# Patient Record
Sex: Female | Born: 1975 | Race: White | Hispanic: No | Smoking: Former smoker
Health system: Southern US, Community
[De-identification: ages and names within clinical notes are randomized; demographics above are authoritative.]

## PROBLEM LIST (undated history)

## (undated) DIAGNOSIS — F419 Anxiety disorder, unspecified: Secondary | ICD-10-CM

## (undated) DIAGNOSIS — D693 Immune thrombocytopenic purpura: Secondary | ICD-10-CM

## (undated) DIAGNOSIS — G40909 Epilepsy, unspecified, not intractable, without status epilepticus: Secondary | ICD-10-CM

## (undated) DIAGNOSIS — E669 Obesity, unspecified: Secondary | ICD-10-CM

## (undated) DIAGNOSIS — D649 Anemia, unspecified: Secondary | ICD-10-CM

## (undated) HISTORY — PX: CHOLECYSTECTOMY: SHX55

## (undated) HISTORY — PX: KNEE ARTHROSCOPY: SUR90

## (undated) HISTORY — PX: TONSILLECTOMY: SUR1361

## (undated) HISTORY — PX: ANKLE ARTHROSCOPY: SUR85

## (undated) HISTORY — PX: OTHER SURGICAL HISTORY: SHX169

---

## 2003-01-25 ENCOUNTER — Ambulatory Visit (HOSPITAL_BASED_OUTPATIENT_CLINIC_OR_DEPARTMENT_OTHER): Admission: RE | Admit: 2003-01-25 | Discharge: 2003-01-25 | Payer: Self-pay | Admitting: Orthopedic Surgery

## 2003-04-12 ENCOUNTER — Ambulatory Visit (HOSPITAL_BASED_OUTPATIENT_CLINIC_OR_DEPARTMENT_OTHER): Admission: RE | Admit: 2003-04-12 | Discharge: 2003-04-12 | Payer: Self-pay | Admitting: Orthopedic Surgery

## 2003-08-13 ENCOUNTER — Inpatient Hospital Stay (HOSPITAL_COMMUNITY): Admission: EM | Admit: 2003-08-13 | Discharge: 2003-08-14 | Payer: Self-pay | Admitting: *Deleted

## 2003-08-28 ENCOUNTER — Inpatient Hospital Stay (HOSPITAL_COMMUNITY): Admission: RE | Admit: 2003-08-28 | Discharge: 2003-08-30 | Payer: Self-pay | Admitting: Orthopedic Surgery

## 2004-06-22 HISTORY — PX: SPLENECTOMY, TOTAL: SHX788

## 2004-07-16 ENCOUNTER — Ambulatory Visit: Payer: Self-pay | Admitting: Internal Medicine

## 2004-09-26 ENCOUNTER — Ambulatory Visit: Payer: Self-pay | Admitting: Obstetrics and Gynecology

## 2005-09-15 ENCOUNTER — Emergency Department: Payer: Self-pay | Admitting: Emergency Medicine

## 2005-09-15 ENCOUNTER — Other Ambulatory Visit: Payer: Self-pay

## 2006-10-01 ENCOUNTER — Emergency Department: Payer: Self-pay | Admitting: Emergency Medicine

## 2007-06-23 HISTORY — PX: TUBAL LIGATION: SHX77

## 2007-07-19 ENCOUNTER — Ambulatory Visit: Payer: Self-pay | Admitting: Internal Medicine

## 2007-08-28 ENCOUNTER — Ambulatory Visit: Payer: Self-pay | Admitting: Family Medicine

## 2007-12-28 ENCOUNTER — Ambulatory Visit: Payer: Self-pay | Admitting: Gynecology

## 2007-12-28 ENCOUNTER — Encounter (INDEPENDENT_AMBULATORY_CARE_PROVIDER_SITE_OTHER): Payer: Self-pay | Admitting: Gynecology

## 2008-02-21 ENCOUNTER — Ambulatory Visit: Payer: Self-pay | Admitting: Gynecology

## 2008-02-21 ENCOUNTER — Ambulatory Visit (HOSPITAL_COMMUNITY): Admission: RE | Admit: 2008-02-21 | Discharge: 2008-02-21 | Payer: Self-pay | Admitting: Gynecology

## 2008-11-07 ENCOUNTER — Emergency Department (HOSPITAL_COMMUNITY): Admission: EM | Admit: 2008-11-07 | Discharge: 2008-11-07 | Payer: Self-pay | Admitting: Emergency Medicine

## 2009-05-08 ENCOUNTER — Emergency Department (HOSPITAL_COMMUNITY): Admission: EM | Admit: 2009-05-08 | Discharge: 2009-05-08 | Payer: Self-pay | Admitting: Emergency Medicine

## 2010-03-13 ENCOUNTER — Emergency Department (HOSPITAL_COMMUNITY)
Admission: EM | Admit: 2010-03-13 | Discharge: 2010-03-13 | Payer: Self-pay | Source: Home / Self Care | Admitting: Emergency Medicine

## 2010-05-06 ENCOUNTER — Emergency Department (HOSPITAL_BASED_OUTPATIENT_CLINIC_OR_DEPARTMENT_OTHER): Admission: EM | Admit: 2010-05-06 | Discharge: 2010-05-06 | Payer: Self-pay | Admitting: Emergency Medicine

## 2010-09-24 LAB — COMPREHENSIVE METABOLIC PANEL
ALT: 19 U/L (ref 0–35)
AST: 24 U/L (ref 0–37)
Albumin: 3.7 g/dL (ref 3.5–5.2)
Alkaline Phosphatase: 90 U/L (ref 39–117)
BUN: 9 mg/dL (ref 6–23)
CO2: 23 mEq/L (ref 19–32)
Calcium: 8.3 mg/dL — ABNORMAL LOW (ref 8.4–10.5)
Chloride: 112 mEq/L (ref 96–112)
Creatinine, Ser: 0.45 mg/dL (ref 0.4–1.2)
GFR calc Af Amer: >60 mL/min (ref >60)
GFR calc non Af Amer: >60 mL/min (ref >60)
Glucose, Bld: 99 mg/dL (ref 70–99)
Potassium: 3.5 mEq/L (ref 3.5–5.1)
Sodium: 140 mEq/L (ref 135–145)
Total Bilirubin: 0.2 mg/dL — ABNORMAL LOW (ref 0.3–1.2)
Total Protein: 6.4 g/dL (ref 6.0–8.3)

## 2010-09-24 LAB — CBC
HCT: 36.5 % (ref 36.0–46.0)
Hemoglobin: 11.7 g/dL — ABNORMAL LOW (ref 12.0–15.0)
MCHC: 31.9 g/dL (ref 30.0–36.0)
MCV: 84.7 fL (ref 78.0–100.0)
Platelets: 215 10*3/uL (ref 150–400)
RBC: 4.31 MIL/uL (ref 3.87–5.11)
RDW: 16.3 % — ABNORMAL HIGH (ref 11.5–15.5)
WBC: 11 10*3/uL — ABNORMAL HIGH (ref 4.0–10.5)
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
## 2010-11-04 NOTE — Op Note (Signed)
NAMEGENEVE, KIMPEL NO.:  [id number]   MEDICAL RECORD NO.:  [id number]          PATIENT TYPE:  AMB   LOCATION:  [facility]                           FACILITY:  [facility]   PHYSICIAN:  Ginger Carne, MD  DATE OF BIRTH:  Apr 07, 1976   DATE OF PROCEDURE:  02/21/2008  DATE OF DISCHARGE:                               OPERATIVE REPORT   PREOPERATIVE DIAGNOSIS:  Sterilization.   POSTOPERATIVE DIAGNOSIS:  Sterilization.   PROCEDURE:  Bilateral laparoscopic tubal cauterization.   SURGEON:  Ginger Carne, MD   ASSISTANT:  None.   COMPLICATIONS:  None immediate.   ESTIMATED BLOOD LOSS:  Minimal.   SPECIMEN:  None.   OPERATIVE FINDINGS:  External genitalia, vulva and vagina were normal.  Cervix, smooth without erosions or lesions.  The uterus was normal in  size.  Both tubes and ovaries appeared normal.  Both tubes were  identified, separated apart from their respective round ligaments.  No  intrapelvic pathology noted.   OPERATIVE PROCEDURE:  The patient was prepped and draped in usual  fashion and placed in the lithotomy position.  Betadine solution was  used for antiseptic, and the patient was catheterized prior to the  procedure.  After adequate general anesthesia, a tenaculum was placed on  the anterior lip of the cervix and a Hickman tenaculum was placed in the  endocervical canal.  Afterwards, a vertical infraumbilical incision was  made, and a Veress needle placed in the abdomen.  Opening and closing  pressures were 10-15 mmHg.  Needle released, trocar placed in same  incision.  Laparoscope placed in trocar sleeve.  A 5-mm port was made in  the left lower quadrant under direct visualization.  Both tubes were  grasped between the isthmus and ampullary junction, 2-3 cm of tube on  either side with bipolar cauterized and cut in the center of  cauterization.  No active bleeding noted.  Gas released, trocars  removed.  Closure with 10-mm fascia site with 0 Vicryl  suture and 4-0  Vicryl for subcuticular closure.  Instruments and sponge count were  correct.  The patient tolerated the procedure well and returned to the  post anesthesia recovery room in excellent condition.      Ginger Carne, MD  Electronically Signed    SHB/MEDQ  D:  02/21/2008  T:  02/22/2008  Job:  [id number]
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
## 2010-11-07 NOTE — Op Note (Signed)
[name], [name]                          ACCOUNT NO.:  [id number]   MEDICAL RECORD NO.:  [id number]                   PATIENT TYPE:  AMB   LOCATION:  [facility]                                  FACILITY:  [facility]   PHYSICIAN:  Nadara Mustard, M.D.                DATE OF BIRTH:  [birth year]   DATE OF PROCEDURE:  01/25/2003  DATE OF DISCHARGE:                                 OPERATIVE REPORT   PREOPERATIVE DIAGNOSIS:  Osteoarthritis, right ankle.   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   PROCEDURE:  1. Partial synovectomy, right ankle.  2. Removal of multiple loose bodies.  3. Abrasion chondroplasty.   ESTIMATED BLOOD LOSS:  Minimal.   ANTIBIOTICS:  None.   DRAINS:  None.   COMPLICATIONS:  None.   TOURNIQUET TIME:  None.   DISPOSITION:  To the PACU in stable condition.   INDICATIONS FOR PROCEDURE:  The patient is a 36 year old woman who is status  post fracture and internal fixation of  her right ankle. The patient has  undergone multiple surgical procedures and presents at this time with  decreased range of motion and persistent pain. The patient has failed  conservative care. She has had temporary relief with steroid injections and  presents at this time for arthroscopic intervention.   The risks and benefits were  discussed including infection, neurovascular  injury, persistent pain and  need for additional surgery. The patient states  she understands and wishes to proceed at this time.   DESCRIPTION OF PROCEDURE:  The patient was brought to operating room #5 and  underwent a general anesthetic. After adequate levels of anesthesia were  obtained the patient's right lower extremity was placed in the ankle  distractor and the right lower extremity was prepped using Duraprep and  draped into a sterile field.   An 18 guage spinal was used to insert through the anteromedial portal. The  joint was infused with  approximately 8 mL of normal saline. The joint space  was extremely tight. There was good backflow from the 18 guage needle.   The skin was then incised and blunt dissection was carried down to the  capsule. The scope was then inserted through the inferomedial portal.  Visualization showed multiple  loose bodies with grade 4 chondromalacia of  the ankle joint with large osteophytic spurs. An 18 guage spinal was used to  localize the anterolateral portal. Care was taken with the lights off to  ensure that there was no nerve involvement with the portal selection.   After the skin was incised blunt dissection was carried down to the fascia  and a blunt trocar was used to make the anterolateral portal. The shaver was  inserted and multiple loose bodies were  removed as well as abrasion  chondroplasty over the anterolateral aspect of the talar dome. There was a  significant  amount of synovitis in both the gutters and anteriorly. This was  debrided and a large osteophytic bone spur was debrided off the anterior  aspect.   After partial synovectomy, removal of loose bodies and abrasion  chondroplasty, a ring curet was used to smooth the surface of  the abrasion  chondroplasty. After release of pressure from the scope there was good  bleeding from the area of the abrasion chondroplasty. The scope pressure was  then reinflated and the vapor Mitek was used for hemostasis. This was not  used on the cartilage or the bone. Visualization showed improvement in the  joint  surface with the large anterolateral osteochondral defect.   The instruments were removed. The portals were closed using 3-0 nylon. The  wound was then injected with a total of 10 mL of 0.5% Marcaine plain with 4  mg of morphine. The wounds after closure were covered with Adaptic  orthopedic sponges, ABD dressing, Webril and a Coban dressing.   The patient was extubated. She was taken to the PACU in stable condition.  She was  planned for discharge to home. Weight bearing as tolerated. Plan to  follow up in the office in 2 weeks.                                               Nadara Mustard, M.D.    MVD/MEDQ  D:  01/25/2003  T:  01/26/2003  Job:  [id number]
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
## 2010-12-09 ENCOUNTER — Ambulatory Visit: Payer: Self-pay | Admitting: Internal Medicine

## 2012-12-25 ENCOUNTER — Emergency Department: Payer: Self-pay | Admitting: Emergency Medicine

## 2013-08-04 DIAGNOSIS — I10 Essential (primary) hypertension: Secondary | ICD-10-CM | POA: Insufficient documentation

## 2014-06-22 HISTORY — PX: GASTRIC BYPASS: SHX52

## 2014-08-12 DIAGNOSIS — G40909 Epilepsy, unspecified, not intractable, without status epilepticus: Secondary | ICD-10-CM | POA: Insufficient documentation

## 2014-08-12 DIAGNOSIS — D693 Immune thrombocytopenic purpura: Secondary | ICD-10-CM | POA: Insufficient documentation

## 2014-08-13 DIAGNOSIS — F1021 Alcohol dependence, in remission: Secondary | ICD-10-CM | POA: Insufficient documentation

## 2014-08-13 DIAGNOSIS — F4310 Post-traumatic stress disorder, unspecified: Secondary | ICD-10-CM | POA: Insufficient documentation

## 2014-08-23 DIAGNOSIS — F1111 Opioid abuse, in remission: Secondary | ICD-10-CM | POA: Insufficient documentation

## 2015-01-27 ENCOUNTER — Ambulatory Visit
Admission: EM | Admit: 2015-01-27 | Discharge: 2015-01-27 | Disposition: A | Discharge status: Home / Self Care | Payer: Medicaid Other | Attending: Internal Medicine | Admitting: Internal Medicine

## 2015-01-27 ENCOUNTER — Encounter: Payer: Self-pay | Admitting: Emergency Medicine

## 2015-01-27 DIAGNOSIS — M545 Low back pain: Secondary | ICD-10-CM

## 2015-01-27 DIAGNOSIS — M7552 Bursitis of left shoulder: Secondary | ICD-10-CM

## 2015-01-27 HISTORY — DX: Anemia, unspecified: D64.9

## 2015-01-27 HISTORY — DX: Immune thrombocytopenic purpura: D69.3

## 2015-01-27 HISTORY — DX: Anxiety disorder, unspecified: F41.9

## 2015-01-27 HISTORY — DX: Epilepsy, unspecified, not intractable, without status epilepticus: G40.909

## 2015-01-27 MED ORDER — HYDROCODONE-ACETAMINOPHEN 5-325 MG PO TABS: 1.0000 | ORAL_TABLET | Freq: Four times a day (QID) | ORAL | Status: DC | PRN

## 2015-01-27 NOTE — Discharge Instructions (Signed)
Prescription for pain medicine vicodin, to use sparingly. Continue using ice.   Prescription for physical therapy given. Followup up orthopedics if not improving in several days.  Impingement Syndrome, Rotator Cuff, Bursitis with Rehab Impingement syndrome is a condition that involves inflammation of the tendons of the rotator cuff and the subacromial bursa, that causes pain in the shoulder. The rotator cuff consists of four tendons and muscles that control much of the shoulder and upper arm function. The subacromial bursa is a fluid filled sac that helps reduce friction between the rotator cuff and one of the bones of the shoulder (acromion). Impingement syndrome is usually an overuse injury that causes swelling of the bursa (bursitis), swelling of the tendon (tendonitis), and/or a tear of the tendon (strain). Strains are classified into three categories. Grade 1 strains cause pain, but the tendon is not lengthened. Grade 2 strains include a lengthened ligament, due to the ligament being stretched or partially ruptured. With grade 2 strains there is still function, although the function may be decreased. Grade 3 strains include a complete tear of the tendon or muscle, and function is usually impaired. SYMPTOMS   Pain around the shoulder, often at the outer portion of the upper arm.  Pain that gets worse with shoulder function, especially when reaching overhead or lifting.  Sometimes, aching when not using the arm.  Pain that wakes you up at night.  Sometimes, tenderness, swelling, warmth, or redness over the affected area.  Loss of strength.  Limited motion of the shoulder, especially reaching behind the back (to the back pocket or to unhook bra) or across your body.  Crackling sound (crepitation) when moving the arm.  Biceps tendon pain and inflammation (in the front of the shoulder). Worse when bending the elbow or lifting. CAUSES  Impingement syndrome is often an overuse injury, in  which chronic (repetitive) motions cause the tendons or bursa to become inflamed. A strain occurs when a force is paced on the tendon or muscle that is greater than it can withstand. Common mechanisms of injury include: Stress from sudden increase in duration, frequency, or intensity of training.  Direct hit (trauma) to the shoulder.  Aging, erosion of the tendon with normal use.  Bony bump on shoulder (acromial spur). RISK INCREASES WITH:  Contact sports (football, wrestling, boxing).  Throwing sports (baseball, tennis, volleyball).  Weightlifting and bodybuilding.  Heavy labor.  Previous injury to the rotator cuff, including impingement.  Poor shoulder strength and flexibility.  Failure to warm up properly before activity.  Inadequate protective equipment.  Old age.  Bony bump on shoulder (acromial spur). PREVENTION   Warm up and stretch properly before activity.  Allow for adequate recovery between workouts.  Maintain physical fitness:  Strength, flexibility, and endurance.  Cardiovascular fitness.  Learn and use proper exercise technique. PROGNOSIS  If treated properly, impingement syndrome usually goes away within 6 weeks. Sometimes surgery is required.  RELATED COMPLICATIONS   Longer healing time if not properly treated, or if not given enough time to heal.  Recurring symptoms, that result in a chronic condition.  Shoulder stiffness, frozen shoulder, or loss of motion.  Rotator cuff tendon tear.  Recurring symptoms, especially if activity is resumed too soon, with overuse, with a direct blow, or when using poor technique. TREATMENT  Treatment first involves the use of ice and medicine, to reduce pain and inflammation. The use of strengthening and stretching exercises may help reduce pain with activity. These exercises may be performed at home or  with a therapist. If non-surgical treatment is unsuccessful after more than 6 months, surgery may be advised.  After surgery and rehabilitation, activity is usually possible in 3 months.  MEDICATION  If pain medicine is needed, nonsteroidal anti-inflammatory medicines (aspirin and ibuprofen), or other minor pain relievers (acetaminophen), are often advised.  Do not take pain medicine for 7 days before surgery.  Prescription pain relievers may be given, if your caregiver thinks they are needed. Use only as directed and only as much as you need.  Corticosteroid injections may be given by your caregiver. These injections should be reserved for the most serious cases, because they may only be given a certain number of times. HEAT AND COLD  Cold treatment (icing) should be applied for 10 to 15 minutes every 2 to 3 hours for inflammation and pain, and immediately after activity that aggravates your symptoms. Use ice packs or an ice massage.  Heat treatment may be used before performing stretching and strengthening activities prescribed by your caregiver, physical therapist, or athletic trainer. Use a heat pack or a warm water soak. SEEK MEDICAL CARE IF:   Symptoms get worse or do not improve in 4 to 6 weeks, despite treatment.  New, unexplained symptoms develop. (Drugs used in treatment may produce side effects.) EXERCISES  RANGE OF MOTION (ROM) AND STRETCHING EXERCISES - Impingement Syndrome (Rotator Cuff  Tendinitis, Bursitis) These exercises may help you when beginning to rehabilitate your injury. Your symptoms may go away with or without further involvement from your physician, physical therapist or athletic trainer. While completing these exercises, remember:   Restoring tissue flexibility helps normal motion to return to the joints. This allows healthier, less painful movement and activity.  An effective stretch should be held for at least 30 seconds.  A stretch should never be painful. You should only feel a gentle lengthening or release in the stretched tissue. STRETCH - Flexion,  Standing  Stand with good posture. With an underhand grip on your right / left hand, and an overhand grip on the opposite hand, grasp a broomstick or cane so that your hands are a little more than shoulder width apart.  Keeping your right / left elbow straight and shoulder muscles relaxed, push the stick with your opposite hand, to raise your right / left arm in front of your body and then overhead. Raise your arm until you feel a stretch in your right / left shoulder, but before you have increased shoulder pain.  Try to avoid shrugging your right / left shoulder as your arm rises, by keeping your shoulder blade tucked down and toward your mid-back spine. Hold for __________ seconds.  Slowly return to the starting position. Repeat __________ times. Complete this exercise __________ times per day. STRETCH - Abduction, Supine  Lie on your back. With an underhand grip on your right / left hand and an overhand grip on the opposite hand, grasp a broomstick or cane so that your hands are a little more than shoulder width apart.  Keeping your right / left elbow straight and your shoulder muscles relaxed, push the stick with your opposite hand, to raise your right / left arm out to the side of your body and then overhead. Raise your arm until you feel a stretch in your right / left shoulder, but before you have increased shoulder pain.  Try to avoid shrugging your right / left shoulder as your arm rises, by keeping your shoulder blade tucked down and toward your mid-back spine. Hold  for __________ seconds.  Slowly return to the starting position. Repeat __________ times. Complete this exercise __________ times per day. ROM - Flexion, Active-Assisted  Lie on your back. You may bend your knees for comfort.  Grasp a broomstick or cane so your hands are about shoulder width apart. Your right / left hand should grip the end of the stick, so that your hand is positioned "thumbs-up," as if you were about to  shake hands.  Using your healthy arm to lead, raise your right / left arm overhead, until you feel a gentle stretch in your shoulder. Hold for __________ seconds.  Use the stick to assist in returning your right / left arm to its starting position. Repeat __________ times. Complete this exercise __________ times per day.  ROM - Internal Rotation, Supine   Lie on your back on a firm surface. Place your right / left elbow about 60 degrees away from your side. Elevate your elbow with a folded towel, so that the elbow and shoulder are the same height.  Using a broomstick or cane and your strong arm, pull your right / left hand toward your body until you feel a gentle stretch, but no increase in your shoulder pain. Keep your shoulder and elbow in place throughout the exercise.  Hold for __________ seconds. Slowly return to the starting position. Repeat __________ times. Complete this exercise __________ times per day. STRETCH - Internal Rotation  Place your right / left hand behind your back, palm up.  Throw a towel or belt over your opposite shoulder. Grasp the towel with your right / left hand.  While keeping an upright posture, gently pull up on the towel, until you feel a stretch in the front of your right / left shoulder.  Avoid shrugging your right / left shoulder as your arm rises, by keeping your shoulder blade tucked down and toward your mid-back spine.  Hold for __________ seconds. Release the stretch, by lowering your healthy hand. Repeat __________ times. Complete this exercise __________ times per day. ROM - Internal Rotation   Using an underhand grip, grasp a stick behind your back with both hands.  While standing upright with good posture, slide the stick up your back until you feel a mild stretch in the front of your shoulder.  Hold for __________ seconds. Slowly return to your starting position. Repeat __________ times. Complete this exercise __________ times per day.   STRETCH - Posterior Shoulder Capsule   Stand or sit with good posture. Grasp your right / left elbow and draw it across your chest, keeping it at the same height as your shoulder.  Pull your elbow, so your upper arm comes in closer to your chest. Pull until you feel a gentle stretch in the back of your shoulder.  Hold for __________ seconds. Repeat __________ times. Complete this exercise __________ times per day. STRENGTHENING EXERCISES - Impingement Syndrome (Rotator Cuff Tendinitis, Bursitis) These exercises may help you when beginning to rehabilitate your injury. They may resolve your symptoms with or without further involvement from your physician, physical therapist or athletic trainer. While completing these exercises, remember:  Muscles can gain both the endurance and the strength needed for everyday activities through controlled exercises.  Complete these exercises as instructed by your physician, physical therapist or athletic trainer. Increase the resistance and repetitions only as guided.  You may experience muscle soreness or fatigue, but the pain or discomfort you are trying to eliminate should never worsen during these exercises. If this  pain does get worse, stop and make sure you are following the directions exactly. If the pain is still present after adjustments, discontinue the exercise until you can discuss the trouble with your clinician.  During your recovery, avoid activity or exercises which involve actions that place your injured hand or elbow above your head or behind your back or head. These positions stress the tissues which you are trying to heal. STRENGTH - Scapular Depression and Adduction   With good posture, sit on a firm chair. Support your arms in front of you, with pillows, arm rests, or on a table top. Have your elbows in line with the sides of your body.  Gently draw your shoulder blades down and toward your mid-back spine. Gradually increase the tension,  without tensing the muscles along the top of your shoulders and the back of your neck.  Hold for __________ seconds. Slowly release the tension and relax your muscles completely before starting the next repetition.  After you have practiced this exercise, remove the arm support and complete the exercise in standing as well as sitting position. Repeat __________ times. Complete this exercise __________ times per day.  STRENGTH - Shoulder Abductors, Isometric  With good posture, stand or sit about 4-6 inches from a wall, with your right / left side facing the wall.  Bend your right / left elbow. Gently press your right / left elbow into the wall. Increase the pressure gradually, until you are pressing as hard as you can, without shrugging your shoulder or increasing any shoulder discomfort.  Hold for __________ seconds.  Release the tension slowly. Relax your shoulder muscles completely before you begin the next repetition. Repeat __________ times. Complete this exercise __________ times per day.  STRENGTH - External Rotators, Isometric  Keep your right / left elbow at your side and bend it 90 degrees.  Step into a door frame so that the outside of your right / left wrist can press against the door frame without your upper arm leaving your side.  Gently press your right / left wrist into the door frame, as if you were trying to swing the back of your hand away from your stomach. Gradually increase the tension, until you are pressing as hard as you can, without shrugging your shoulder or increasing any shoulder discomfort.  Hold for __________ seconds.  Release the tension slowly. Relax your shoulder muscles completely before you begin the next repetition. Repeat __________ times. Complete this exercise __________ times per day.  STRENGTH - Supraspinatus   Stand or sit with good posture. Grasp a __________ weight, or an exercise band or tubing, so that your hand is "thumbs-up," like you  are shaking hands.  Slowly lift your right / left arm in a "V" away from your thigh, diagonally into the space between your side and straight ahead. Lift your hand to shoulder height or as far as you can, without increasing any shoulder pain. At first, many people do not lift their hands above shoulder height.  Avoid shrugging your right / left shoulder as your arm rises, by keeping your shoulder blade tucked down and toward your mid-back spine.  Hold for __________ seconds. Control the descent of your hand, as you slowly return to your starting position. Repeat __________ times. Complete this exercise __________ times per day.  STRENGTH - External Rotators  Secure a rubber exercise band or tubing to a fixed object (table, pole) so that it is at the same height as your right /  left elbow when you are standing or sitting on a firm surface.  Stand or sit so that the secured exercise band is at your uninjured side.  Bend your right / left elbow 90 degrees. Place a folded towel or small pillow under your right / left arm, so that your elbow is a few inches away from your side.  Keeping the tension on the exercise band, pull it away from your body, as if pivoting on your elbow. Be sure to keep your body steady, so that the movement is coming only from your rotating shoulder.  Hold for __________ seconds. Release the tension in a controlled manner, as you return to the starting position. Repeat __________ times. Complete this exercise __________ times per day.  STRENGTH - Internal Rotators   Secure a rubber exercise band or tubing to a fixed object (table, pole) so that it is at the same height as your right / left elbow when you are standing or sitting on a firm surface.  Stand or sit so that the secured exercise band is at your right / left side.  Bend your elbow 90 degrees. Place a folded towel or small pillow under your right / left arm so that your elbow is a few inches away from your  side.  Keeping the tension on the exercise band, pull it across your body, toward your stomach. Be sure to keep your body steady, so that the movement is coming only from your rotating shoulder.  Hold for __________ seconds. Release the tension in a controlled manner, as you return to the starting position. Repeat __________ times. Complete this exercise __________ times per day.  STRENGTH - Scapular Protractors, Standing   Stand arms length away from a wall. Place your hands on the wall, keeping your elbows straight.  Begin by dropping your shoulder blades down and toward your mid-back spine.  To strengthen your protractors, keep your shoulder blades down, but slide them forward on your rib cage. It will feel as if you are lifting the back of your rib cage away from the wall. This is a subtle motion and can be challenging to complete. Ask your caregiver for further instruction, if you are not sure you are doing the exercise correctly.  Hold for __________ seconds. Slowly return to the starting position, resting the muscles completely before starting the next repetition. Repeat __________ times. Complete this exercise __________ times per day. STRENGTH - Scapular Protractors, Supine  Lie on your back on a firm surface. Extend your right / left arm straight into the air while holding a __________ weight in your hand.  Keeping your head and back in place, lift your shoulder off the floor.  Hold for __________ seconds. Slowly return to the starting position, and allow your muscles to relax completely before starting the next repetition. Repeat __________ times. Complete this exercise __________ times per day. STRENGTH - Scapular Protractors, Quadruped  Get onto your hands and knees, with your shoulders directly over your hands (or as close as you can be, comfortably).  Keeping your elbows locked, lift the back of your rib cage up into your shoulder blades, so your mid-back rounds out. Keep  your neck muscles relaxed.  Hold this position for __________ seconds. Slowly return to the starting position and allow your muscles to relax completely before starting the next repetition. Repeat __________ times. Complete this exercise __________ times per day.  STRENGTH - Scapular Retractors  Secure a rubber exercise band or tubing to a  fixed object (table, pole), so that it is at the height of your shoulders when you are either standing, or sitting on a firm armless chair.  With a palm down grip, grasp an end of the band in each hand. Straighten your elbows and lift your hands straight in front of you, at shoulder height. Step back, away from the secured end of the band, until it becomes tense.  Squeezing your shoulder blades together, draw your elbows back toward your sides, as you bend them. Keep your upper arms lifted away from your body throughout the exercise.  Hold for __________ seconds. Slowly ease the tension on the band, as you reverse the directions and return to the starting position. Repeat __________ times. Complete this exercise __________ times per day. STRENGTH - Shoulder Extensors   Secure a rubber exercise band or tubing to a fixed object (table, pole) so that it is at the height of your shoulders when you are either standing, or sitting on a firm armless chair.  With a thumbs-up grip, grasp an end of the band in each hand. Straighten your elbows and lift your hands straight in front of you, at shoulder height. Step back, away from the secured end of the band, until it becomes tense.  Squeezing your shoulder blades together, pull your hands down to the sides of your thighs. Do not allow your hands to go behind you.  Hold for __________ seconds. Slowly ease the tension on the band, as you reverse the directions and return to the starting position. Repeat __________ times. Complete this exercise __________ times per day.  STRENGTH - Scapular Retractors and External  Rotators   Secure a rubber exercise band or tubing to a fixed object (table, pole) so that it is at the height as your shoulders, when you are either standing, or sitting on a firm armless chair.  With a palm down grip, grasp an end of the band in each hand. Bend your elbows 90 degrees and lift your elbows to shoulder height, at your sides. Step back, away from the secured end of the band, until it becomes tense.  Squeezing your shoulder blades together, rotate your shoulders so that your upper arms and elbows remain stationary, but your fists travel upward to head height.  Hold for __________ seconds. Slowly ease the tension on the band, as you reverse the directions and return to the starting position. Repeat __________ times. Complete this exercise __________ times per day.  STRENGTH - Scapular Retractors and External Rotators, Rowing   Secure a rubber exercise band or tubing to a fixed object (table, pole) so that it is at the height of your shoulders, when you are either standing, or sitting on a firm armless chair.  With a palm down grip, grasp an end of the band in each hand. Straighten your elbows and lift your hands straight in front of you, at shoulder height. Step back, away from the secured end of the band, until it becomes tense.  Step 1: Squeeze your shoulder blades together. Bending your elbows, draw your hands to your chest, as if you are rowing a boat. At the end of this motion, your hands and elbow should be at shoulder height and your elbows should be out to your sides.  Step 2: Rotate your shoulders, to raise your hands above your head. Your forearms should be vertical and your upper arms should be horizontal.  Hold for __________ seconds. Slowly ease the tension on the band, as you  reverse the directions and return to the starting position. Repeat __________ times. Complete this exercise __________ times per day.  STRENGTH - Scapular Depressors  Find a sturdy chair  without wheels, such as a dining room chair.  Keeping your feet on the floor, and your hands on the chair arms, lift your bottom up from the seat, and lock your elbows.  Keeping your elbows straight, allow gravity to pull your body weight down. Your shoulders will rise toward your ears.  Raise your body against gravity by drawing your shoulder blades down your back, shortening the distance between your shoulders and ears. Although your feet should always maintain contact with the floor, your feet should progressively support less body weight, as you get stronger.  Hold for __________ seconds. In a controlled and slow manner, lower your body weight to begin the next repetition. Repeat __________ times. Complete this exercise __________ times per day.  Document Released: 06/08/2005 Document Revised: 08/31/2011 Document Reviewed: 09/20/2008 [facility] Patient Information 2015 [place], [place]. This information is not intended to replace advice given to you by your health care provider. Make sure you discuss any questions you have with your health care provider.

## 2015-01-27 NOTE — ED Provider Notes (Addendum)
CSN: [id number]     Arrival date & time 01/27/15  1238 History   First MD Initiated Contact with Patient 01/27/15 1337     Chief Complaint  Patient presents with  . Back Pain   HPI  Patient is a 39 year old lady with past medical history notable for ITP, on immunosuppressive therapy. She has been helping and nephew move, packing boxes and helping him packed clothes, and had the onset about 4 days ago of right mid back pain and left arm and shoulder pain. She is not able to take anti-inflammatories, because of the ITP. She is also supposed to avoid steroids. She has been using ice, heat, and Tylenol, without sufficient relief. The back pain is fairly constant, nonradiating. The left arm and shoulder pain is intermittent, worse with movements at the shoulder and upper arm. She denies leg weakness/clumsiness. No change in bowel or bladder function reported. No loss of sensation. She was able to ambulate independently into the urgent care.  Past Medical History  Diagnosis Date  . ITP (idiopathic thrombocytopenic purpura)   . Epilepsy   . Anemia   . Anxiety    Past Surgical History  Procedure Laterality Date  . Gastric bypass    . Ankle arthroscopy    . Knee arthroscopy    . Left humorous surgery    . Tubal ligation    . Splenectomy, total     Family History  Problem Relation Age of Onset  . Cancer Father   . Diabetes Father   . Hypertension Father    History  Substance Use Topics  . Smoking status: Never Smoker   . Smokeless tobacco: Never Used  . Alcohol Use: No    Review of Systems  All other systems reviewed and are negative.   Allergies  Ace inhibitors  Home Medications   Prior to Admission medications   Medication Sig Start Date End Date Taking? Authorizing Provider  ARIPiprazole (ABILIFY) 10 MG tablet Take 10 mg by mouth daily.   Yes Historical Provider, MD  gabapentin (NEURONTIN) 400 MG capsule Take 400 mg by mouth 3 (three) times daily.   Yes Historical  Provider, MD  mycophenolate (CELLCEPT) 500 MG tablet Take 1,000 mg by mouth 2 (two) times daily. Taking 2000 mg twice a day   Yes Historical Provider, MD  prazosin (MINIPRESS) 1 MG capsule Take 1 mg by mouth at bedtime.   Yes Historical Provider, MD  topiramate (TOPAMAX) 200 MG tablet Take 200 mg by mouth 2 (two) times daily.   Yes Historical Provider, MD  traZODone (DESYREL) 150 MG tablet Take by mouth at bedtime.   Yes Historical Provider, MD          BP 91/50 mmHg  Pulse 101  Temp(Src) 97.4 F (36.3 C) (Tympanic)  Resp 16  Ht  (1.575 m)  Wt 218 lb (98.884 kg)  BMI 39.86 kg/m2  SpO2 96%  LMP 01/11/2015 Physical Exam  Constitutional: She is oriented to person, place, and time. No distress.  Alert, nicely groomed  HENT:  Head: Atraumatic.  Eyes:  Conjugate gaze, no eye redness/drainage  Neck: Neck supple.  Cardiovascular: Normal rate.   Pulmonary/Chest: No respiratory distress.  Abdominal: She exhibits no distension.  Musculoskeletal: Normal range of motion.  Full range of motion at the left shoulder, but external rotation is quite painful, and reproduces symptoms. Palpation of the mid and right upper lumbar spine is tender, and there is some spasm present in the right paralumbar area. Strength  in the bilateral lower extremities is 5/5, symmetric, proximal and distal. Resisted flexion of the hip flexors is painful. Strength in the bilateral upper extremities is 5/5 and symmetric, proximal and distal. A large dark bruise is present over the belly of the left biceps muscle, patient attributes this to ITP and says it is been there for a couple weeks. She bruises easily. No warmth or erythema.   Neurological: She is alert and oriented to person, place, and time.  Was able to walk into the urgent care independently. Is able to move from supine position to seated position on the stretcher independently and quickly.  Skin: Skin is warm and dry.  No cyanosis  Nursing note and  vitals reviewed.   ED Course  Procedures  none  MDM   1. Low back pain, unspecified back pain laterality, with sciatica presence unspecified   2. Acute shoulder bursitis, left    Discharge Medication List as of 01/27/2015  2:10 PM    START taking these medications   Details  HYDROcodone-acetaminophen (NORCO/VICODIN) 5-325 MG per tablet Take 1-2 tablets by mouth every 6 (six) hours as needed for severe pain (use sparingly)., Starting 01/27/2015, Until Discontinued, Print      rx for vicodin (#20) tablets. Use ice to painful areas. rx for physical therapy. Followup pcp/Dr Gavin Potters or orthopedics for persistent sx's.   Eustace Moore, MD 01/27/15 1745  Eustace Moore, MD 01/27/15 [phone]

## 2015-01-27 NOTE — ED Notes (Signed)
Was helping family member move furniture and hurt back. Painful for 3 days

## 2015-05-09 DIAGNOSIS — E559 Vitamin D deficiency, unspecified: Secondary | ICD-10-CM | POA: Insufficient documentation

## 2015-05-09 DIAGNOSIS — Z9884 Bariatric surgery status: Secondary | ICD-10-CM | POA: Insufficient documentation

## 2015-10-25 DIAGNOSIS — F1011 Alcohol abuse, in remission: Secondary | ICD-10-CM | POA: Insufficient documentation

## 2015-11-09 DIAGNOSIS — F339 Major depressive disorder, recurrent, unspecified: Secondary | ICD-10-CM | POA: Insufficient documentation

## 2016-01-31 ENCOUNTER — Encounter: Payer: Self-pay | Admitting: Emergency Medicine

## 2016-01-31 ENCOUNTER — Ambulatory Visit
Admission: EM | Admit: 2016-01-31 | Discharge: 2016-01-31 | Disposition: A | Discharge status: Home / Self Care | Payer: Medicaid Other | Attending: Internal Medicine | Admitting: Internal Medicine

## 2016-01-31 DIAGNOSIS — D696 Thrombocytopenia, unspecified: Secondary | ICD-10-CM | POA: Insufficient documentation

## 2016-01-31 DIAGNOSIS — F419 Anxiety disorder, unspecified: Secondary | ICD-10-CM | POA: Insufficient documentation

## 2016-01-31 DIAGNOSIS — M79631 Pain in right forearm: Secondary | ICD-10-CM | POA: Diagnosis not present

## 2016-01-31 DIAGNOSIS — M79601 Pain in right arm: Secondary | ICD-10-CM | POA: Diagnosis not present

## 2016-01-31 DIAGNOSIS — G40909 Epilepsy, unspecified, not intractable, without status epilepticus: Secondary | ICD-10-CM | POA: Diagnosis not present

## 2016-01-31 LAB — COMPREHENSIVE METABOLIC PANEL
ALT: 19 U/L (ref 14–54)
AST: 28 U/L (ref 15–41)
Albumin: 3.3 g/dL — ABNORMAL LOW (ref 3.5–5.0)
Alkaline Phosphatase: 78 U/L (ref 38–126)
Anion gap: 8 (ref 5–15)
BUN: 11 mg/dL (ref 6–20)
CO2: 22 mmol/L (ref 22–32)
Calcium: 8.2 mg/dL — ABNORMAL LOW (ref 8.9–10.3)
Chloride: 106 mmol/L (ref 101–111)
Creatinine, Ser: 0.42 mg/dL — ABNORMAL LOW (ref 0.44–1.00)
GFR calc Af Amer: >60 mL/min (ref >60)
GFR calc non Af Amer: >60 mL/min (ref >60)
Glucose, Bld: 109 mg/dL — ABNORMAL HIGH (ref 65–99)
Potassium: 3.7 mmol/L (ref 3.5–5.1)
Sodium: 136 mmol/L (ref 135–145)
Total Bilirubin: 0.5 mg/dL (ref 0.3–1.2)
Total Protein: 6.7 g/dL (ref 6.5–8.1)

## 2016-01-31 LAB — APTT: aPTT: 24 seconds (ref 24–36)

## 2016-01-31 LAB — CBC WITH DIFFERENTIAL/PLATELET
Basophils Absolute: 0.2 10*3/uL — ABNORMAL HIGH (ref 0–0.1)
Basophils Relative: 1 %
Eosinophils Absolute: 0 10*3/uL (ref 0–0.7)
Eosinophils Relative: 0 %
HCT: 40.9 % (ref 35.0–47.0)
Hemoglobin: 13.4 g/dL (ref 12.0–16.0)
Lymphocytes Relative: 12 %
Lymphs Abs: 2.6 10*3/uL (ref 1.0–3.6)
MCH: 31.2 pg (ref 26.0–34.0)
MCHC: 32.8 g/dL (ref 32.0–36.0)
MCV: 95.3 fL (ref 80.0–100.0)
Monocytes Absolute: 1.4 10*3/uL — ABNORMAL HIGH (ref 0.2–0.9)
Monocytes Relative: 7 %
Neutro Abs: 16.6 10*3/uL — ABNORMAL HIGH (ref 1.4–6.5)
Neutrophils Relative %: 80 %
Platelets: 205 10*3/uL (ref 150–440)
RBC: 4.3 MIL/uL (ref 3.80–5.20)
RDW: 17.9 % — ABNORMAL HIGH (ref 11.5–14.5)
WBC: 20.8 10*3/uL — ABNORMAL HIGH (ref 3.6–11.0)

## 2016-01-31 LAB — PROTIME-INR
INR: 0.98
Prothrombin Time: 12.9 seconds (ref 11.4–15.2)

## 2016-01-31 MED ORDER — SULFAMETHOXAZOLE-TRIMETHOPRIM 800-160 MG PO TABS: 1.0000 | ORAL_TABLET | Freq: Two times a day (BID) | ORAL | 0 refills | Status: DC

## 2016-01-31 MED ORDER — ACETAMINOPHEN-CODEINE #2 300-15 MG PO TABS: 1.0000 | ORAL_TABLET | Freq: Two times a day (BID) | ORAL | 0 refills | Status: AC | PRN

## 2016-01-31 NOTE — ED Notes (Signed)
Spoke to US tech Tiffany about patient needing a STAT US.  Was told to go ahead and send [facility] to get registered for this test.

## 2016-01-31 NOTE — ED Triage Notes (Signed)
Patient c/o pain in her right forearm for the past 3 days.  Patient reports increase in pain with movement of the right arm and hand.  Patient denies injury.

## 2016-01-31 NOTE — ED Provider Notes (Signed)
CSN: [id number]     Arrival date & time 01/31/16  1544 History   First MD Initiated Contact with Patient 01/31/16 1635     Chief Complaint  Patient presents with  . Arm Pain    right arm   (Consider location/radiation/quality/duration/timing/severity/associated sxs/prior Treatment) Single caucasian female here for evaluation of right forearm pain and lump that started 3 days ago along with bruising with no known trauma.  "They just popped up" Patient on disability has not worked for years per patient.  She does not exercise, performs light housework cleaning and laundry only.  Right hand dominant.  Last doctor appt Monday at [facility] platelets were low and she was started on dexamethasone 400mg  po daily Tuesday 01/28/2016 for her low platelets.  Last dexamethasone use Nov 2016 denied side effects.  On topamax for migraine prevention.  Keppra for seizures last seizure activity per patient 2-3 years ago usually gets aura that lasts most of day before seizure occurs "floating over her body looking at herself sensation" Has tried motrin 800mg  po TID prn pain and tylenol 650mg  po TID prn pain without any relief of pain and that is why she is here today.  Denied history of trauma or blood clots.  Last blood draw Monday right hand bruised but not swollen or tender.  Gets IV iron infusions through her port every 2 weeks and labs every 2 weeks. Denied falls.  Denied new exercise program, HI/SI, anyone else hurting her.  PMHX: epilepsy, migraines, ITP, anxiety  PSHx right ankle and knee and left humerous  FHx father-cancer  LMP 01/13/2016 Denied tobacco or alcohol use       Past Medical History:  Diagnosis Date  . Anemia   . Anxiety   . Epilepsy (HCC)   . ITP (idiopathic thrombocytopenic purpura)    Past Surgical History:  Procedure Laterality Date  . ANKLE ARTHROSCOPY    . GASTRIC BYPASS    . KNEE ARTHROSCOPY    . left humorous surgery    . SPLENECTOMY, TOTAL    . TUBAL LIGATION     Family History    Problem Relation Age of Onset  . Cancer Father   . Diabetes Father   . Hypertension Father    Social History  Substance Use Topics  . Smoking status: Never Smoker  . Smokeless tobacco: Never Used  . Alcohol use No   OB History    No data available     Review of Systems  Constitutional: Negative for chills, diaphoresis, fatigue, fever and unexpected weight change.  HENT: Negative for congestion, dental problem, drooling, ear discharge, ear pain, facial swelling, hearing loss, mouth sores, nosebleeds, postnasal drip, rhinorrhea, sinus pressure, sneezing, sore throat, tinnitus, trouble swallowing and voice change.   Eyes: Negative for photophobia, pain, discharge, redness, itching and visual disturbance.  Respiratory: Negative for cough, choking, chest tightness, shortness of breath, wheezing and stridor.   Cardiovascular: Negative for chest pain, palpitations and leg swelling.  Gastrointestinal: Negative for abdominal distention, abdominal pain, constipation, diarrhea, nausea and vomiting.  Endocrine: Negative for polydipsia, polyphagia and polyuria.  Genitourinary: Negative for difficulty urinating, dysuria, flank pain, frequency, hematuria, menstrual problem, pelvic pain and vaginal bleeding.  Musculoskeletal: Positive for myalgias. Negative for arthralgias, back pain, gait problem, joint swelling, neck pain and neck stiffness.  Skin: Positive for color change and rash. Negative for pallor and wound.  Allergic/Immunologic: Negative for environmental allergies, food allergies and immunocompromised state.  Neurological: Negative for dizziness, tremors, seizures, syncope, facial asymmetry,  speech difficulty, weakness, light-headedness, numbness and headaches.  Hematological: Negative for adenopathy. Bruises/bleeds easily.  Psychiatric/Behavioral: Negative for agitation, behavioral problems, confusion, decreased concentration, hallucinations, self-injury, sleep disturbance and suicidal  ideas. The patient is not nervous/anxious and is not hyperactive.   All other systems reviewed and are negative.   Allergies  Ace inhibitors  Home Medications   Prior to Admission medications   Medication Sig Start Date End Date Taking? Authorizing Provider  DEXAMETHASONE PO Take 40 mg by mouth daily.   Yes Historical Provider, MD  levETIRAcetam (KEPPRA) 750 MG tablet Take 750 mg by mouth daily.   Yes Historical Provider, MD  topiramate (TOPAMAX) 200 MG tablet Take 200 mg by mouth 2 (two) times daily.    Historical Provider, MD  traZODone (DESYREL) 150 MG tablet Take by mouth at bedtime.    Historical Provider, MD   Meds Ordered and Administered this Visit  Medications - No data to display  BP (!) 152/90 (BP Location: Left Arm)   Pulse 97   Temp 97.2 F (36.2 C) (Tympanic)   Resp 16   LMP 01/13/2016   SpO2 99%  No data found.   Physical Exam  Constitutional: She is oriented to person, place, and time. She appears well-developed and well-nourished. She is active and cooperative.  Non-toxic appearance. She does not have a sickly appearance. She does not appear ill. No distress.  HENT:  Head: Normocephalic and atraumatic.  Right Ear: Hearing, external ear and ear canal normal. A middle ear effusion is present.  Left Ear: Hearing, external ear and ear canal normal. A middle ear effusion is present.  Nose: No mucosal edema, rhinorrhea, nose lacerations, sinus tenderness, nasal deformity, septal deviation or nasal septal hematoma. No epistaxis.  No foreign bodies. Right sinus exhibits no maxillary sinus tenderness and no frontal sinus tenderness. Left sinus exhibits no maxillary sinus tenderness and no frontal sinus tenderness.  Mouth/Throat: Uvula is midline and mucous membranes are normal. Mucous membranes are not pale, not dry and not cyanotic. She does not have dentures. No oral lesions. No trismus in the jaw. Normal dentition. No dental abscesses, uvula swelling, lacerations or  dental caries. Posterior oropharyngeal edema and posterior oropharyngeal erythema present. No oropharyngeal exudate or tonsillar abscesses. Tonsils are 0 on the right. Tonsils are 0 on the left. No tonsillar exudate.  Cobblestoning posterior pharynx; bilateral TMS with air fluid level clear; bilateral nasal turbinates edema/erythema   Eyes: Conjunctivae, EOM and lids are normal. Pupils are equal, round, and reactive to light. Right eye exhibits no chemosis, no discharge, no exudate and no hordeolum. No foreign body present in the right eye. Left eye exhibits no chemosis, no discharge, no exudate and no hordeolum. No foreign body present in the left eye. Right conjunctiva is not injected. Right conjunctiva has no hemorrhage. Left conjunctiva is not injected. Left conjunctiva has no hemorrhage. No scleral icterus. Right eye exhibits normal extraocular motion and no nystagmus. Left eye exhibits normal extraocular motion and no nystagmus. Right pupil is round and reactive. Left pupil is round and reactive. Pupils are equal.  Neck: Trachea normal and normal range of motion. Neck supple. No tracheal tenderness, no spinous process tenderness and no muscular tenderness present. No neck rigidity. No tracheal deviation, no edema, no erythema and normal range of motion present. No thyroid mass and no thyromegaly present.  Cardiovascular: Normal rate, regular rhythm, S1 normal, S2 normal, normal heart sounds and intact distal pulses.  PMI is not displaced.  Exam reveals no  gallop and no friction rub.   No murmur heard. Pulses:      Radial pulses are 2+ on the right side, and 2+ on the left side.  Pulmonary/Chest: Effort normal and breath sounds normal. No accessory muscle usage or stridor. No respiratory distress. She has no decreased breath sounds. She has no wheezes. She has no rhonchi. She has no rales. She exhibits no tenderness.  Abdominal: Soft. Normal appearance. She exhibits no distension.  Musculoskeletal:  Normal range of motion.       Right shoulder: Normal.       Left shoulder: Normal.       Right elbow: Normal.      Left elbow: Normal.       Right wrist: Normal. She exhibits normal range of motion, no tenderness, no bony tenderness, no swelling, no effusion, no crepitus, no deformity and no laceration.       Right hip: Normal.       Left hip: Normal.       Right knee: Normal.       Left knee: Normal.       Right ankle: Normal.       Left ankle: Normal.       Cervical back: Normal.       Right upper arm: Normal.       Left upper arm: Normal.       Right forearm: She exhibits tenderness, swelling and edema. She exhibits no bony tenderness, no deformity and no laceration.       Left forearm: Normal.       Arms:      Right hand: Normal.       Left hand: Normal.  Full arom fingers/wrist/elbows/shoulders/knees/hips/c-spine; equal grip strength bilaterally 5/5 hands; extremity strength x 4 5/5 bilaterally equal; able to make a ok sign bilaterally  Lymphadenopathy:       Head (right side): No submental, no submandibular, no tonsillar, no preauricular, no posterior auricular and no occipital adenopathy present.       Head (left side): No submental, no submandibular, no tonsillar, no preauricular, no posterior auricular and no occipital adenopathy present.    She has no cervical adenopathy.       Right cervical: No superficial cervical, no deep cervical and no posterior cervical adenopathy present.      Left cervical: No superficial cervical, no deep cervical and no posterior cervical adenopathy present.  Neurological: She is alert and oriented to person, place, and time. She has normal strength. She is not disoriented. She displays no atrophy and no tremor. No cranial nerve deficit or sensory deficit. She exhibits normal muscle tone. She displays no seizure activity. Coordination and gait normal. GCS eye subscore is 4. GCS verbal subscore is 5. GCS motor subscore is 6.  Gait sure and steady in  hall; in/out of chair without difficulty  Skin: Skin is warm, dry and intact. Capillary refill takes less than 2 seconds. Bruising, ecchymosis and rash noted. No abrasion, no burn, no laceration, no lesion, no petechiae and no purpura noted. Rash is macular. Rash is not papular, not maculopapular, not nodular, not pustular, not vesicular and not urticarial. She is not diaphoretic. There is erythema. No cyanosis. No pallor. Nails show no clubbing.     Psychiatric: She has a normal mood and affect. Her speech is normal and behavior is normal. Judgment and thought content normal. Cognition and memory are normal.  Nursing note and vitals reviewed.   Urgent Care Course  Clinical Course    Procedures (including critical care time)  Labs Review Labs Reviewed  CBC WITH DIFFERENTIAL/PLATELET - Abnormal; Notable for the following:       Result Value   WBC 20.8 (*)    RDW 17.9 (*)    Neutro Abs 16.6 (*)    Monocytes Absolute 1.4 (*)    Basophils Absolute 0.2 (*)    All other components within normal limits  COMPREHENSIVE METABOLIC PANEL - Abnormal; Notable for the following:    Glucose, Bld 109 (*)    Creatinine, Ser 0.42 (*)    Calcium 8.2 (*)    Albumin 3.3 (*)    All other components within normal limits  PROTIME-INR  APTT    Imaging Review No results found.  Last Name:  [facility]:  First Name:  charnay  Zip Code:  Date of Birth:  05-09-1976  Dispensed Start Date:  01/21/2015 Dispensed End Date:  01/31/2016  Recipients:               Dispensed From 01/21/2015 to 01/31/2016 10 out of 10 Recipient(s) Selected Nor[place], Marygrace - DOB: 1975-12-08 - [address] Roethler, Moraima - DOB: Mar 24, 1976 - [address] Brookneal, Mia - DOB: [birth year] - [address] [place], Jeannemarie - DOB: [birth year] - [address] [place], Ashby - DOB: 04/29/1976 - [address] Freida Busman, Cala Bradford - DOB: 1976-06-03 - [address] [place], [place] S - DOB: Apr 23, 1976 - Need All  Info Wendall Mola SMITH - DOB: May 17, 1976 - [address] [place], [place] - DOB: September 10, 1975 - [address] [place], [place] - DOB: February 15, 1976 - Unable To Obtain " Date Dispensed/ Date Prescribed Drug Name/ NDC Qty. Dispensed/ Days Supply Refill #/ Authorized Refills Gaffer Recipient *Pmt. Method **MED Daily  03/14/2015 10/26/2014 ALPRAZOLAM 0.25 MG TABLET [id number] 30 30 5 5  [id number] [address], [place] [id number] Berline Chough, Garden Krakowski, Arilla 19-Oct-1975 [address] [place], [place] [address] 02 0  03/12/2015 03/12/2015 OXYCODONE HCL 10 MG TABLET [id number] 42 7 0 0 [id number] AREPALLY GOWTHAMI MD [place], [place] [id number] [employer] [place], [place] ANYA, MURPHEY October 01, 1975 Unable to [place], [place] [address] 01 90  03/09/2015 03/09/2015 ACETAMINOPHEN- COD #3 TABLET [id number] 36 3 0 0 [id number] METJIAN ARA D MD [place], [place] [id number] Berline Chough, [place] Stum, Fiza [birth year] [address] [place], [place] [address] 02 54  02/20/2015 02/20/2015 OXYCODONE HCL 5 MG TABLET [id number] 12 2 0 0 [id number] [facility] MAIN [place], [place] [id number] Marjie Skiff, [place] Hinshaw, Kingslee 06/05/1976 [address] [place], [place] [address] 02 45  02/17/2015 10/26/2014 ALPRAZOLAM 0.25 MG TABLET [id number] 30 30 4 5  [id number] SCHRODER MELISSA T [place], [place] [id number] [employer], [place] Whipkey, Clytie 1975-10-19 [address] [place], [place] [address] 02 0  01/27/2015 01/27/2015 HYDROCODON- ACETAMINOPHEN 5- 325 [id number] 20 3 0 0 [id number] MURRAY Renie Ora (MD) [place], [place] [id number] Berline Chough, [place] Fahs, Donnae 02/18/1976 [address] [place], [place] [address] 02 33.33  01/21/2015 10/26/2014 ALPRAZOLAM 0.25 MG TABLET [id number] 30 30 3 5  [id number] SCHRODER MELISSA T , [place] [id number] [employer], [place] Maresh,  Maeryn January 16, 1976 [address] [place],  [address] 02 0    *Pmt. Method:01=Private Pay; 02=Medicaid; 03=Medicare; 04=Commercial Insurance; Psychologist, educational and VA; 06=Worker's Compensation; 07=[place]ations; 99=Other  **Per CDC guidance, the conversion factors and associated daily morphine milligram equivalents for drugs prescribed as part of medication-assisted treatment for opioid use disorder should not be used to benchmark against dosage thresholds meant for opioids prescribed for pain. MED Summary This section displays cumulative MED values by unique recipient. The "MED Max" value is the maximum occurrence of cumulative MED sustained for any 3 consecutive days. This value is calculated based on prescriptions dispensed during the date range requested.  MED Max Recipient  [address] [place], Odaliz [birth year] [address] [place], [place] [id number]  90 Wendall MolaLLEN, Kjirsten [birth year] [place], [place] [id number]      1735 reviewed CBC and CMP results with patient WBC increased 21 today platelets normal today compared to 01/28/2016 [facility] labs CMP stable  PT/PTT/INR pending  Venous US pending  DDx cellulitis, phlebitis, blood clot venous, ITP flare, clotting disorder  Discussed with patient WBC usually only elevates with infection while on steroids.  Patient given copy of lab results available at time of discharge CBC and CMP.  Discussed with patient recommended US to r/o blood clot/phlebitis right forearm and [facility] scheduling contacted and patient stated she would have her mother drive [facility] to check in at Radiology/US for imaging after picking up pain medication at pharmacy because she would be unable to tolerate ultrasound without pain medication and motrin 800mg  and tylenol 1000mg  she took prior to arrival was not sufficient to allow someone to palpate inflamed area repeatedly for US.  Discussed with patient to avoid alcohol intake and driving if she  utilized tylenol #2 tabs 300/15mg  po BID prn pain #4 RF0 prescribed for her today.  Patient verbalized understanding information/instructions agreed with plan of care and had no further questions at this time.  2036 telephone message left for ultrasound tech to verify if patient no showed examination.  Telephone message left for patient to contact clinic to verify if she had US completed or decided not to drive to [facility] with mother for exam.  01 Feb 2016 2018 No verification received from patient or US dept today as to if patient no showed imaging appt. MDM   1. Pain of right upper extremity    Will treat for cellulitis right wrist bactrim ds po BID x 7 days.  Patient stated tylenol and motrin not cutting it for pain.  Discussed with patient NSAIDS could worsen her platelet counts again.  She is still on dexamethasone.  Patient reported she would be unable to tolerate US exam without pain medication Rx given Tylenol #2 300/15mg  po BID prn pain.  Discussed with patient maximum tylenol dose 1000mg  per dose and 4000mg  po per 24 hours.  She can only take 2 regular or 1 extra strength tylenol if taking one Tylenol #2 and tylenol (must reduce regular tylenol dosing.  Exitcare handout on skin infection, phlebitis, blood clot given to patient.  RTC if worsening erythema, pain, purulent discharge, fever.  Wash towels, washcloths, sheets in hot water with bleach every couple of days until infection resolved.  Do not scratch affected area.  ER if red streaks extending into hand or elbow this weekend or fingers become blue/cold.  Follow up with [facility] as scheduled Monday for her routine labs and contact PC[facility].  Will call with PT/PTT/INR results once available.  Patient verbalized understanding, agreed with plan of care and had no further questions at this time.  Barbaraann Barthel, NP 02/01/16 2043

## 2016-02-02 ENCOUNTER — Telehealth: Payer: Self-pay | Admitting: Family Medicine

## 2016-02-02 NOTE — Telephone Encounter (Signed)
Telephone message left for patient PT/PTT/INR normal.  Patient to contact clinic to notify us if she wants to reschedule US or she no longer is going to complete imaging study and verify if symptoms improved with Tylenol #2 and bactrim DS e.g. Redness, pain, tenderness right forearm.

## 2016-02-02 NOTE — Telephone Encounter (Signed)
Per [facility] US tech via telephone today patient no showed US appt 01/31/2016 at 1800.  PT/PTT/INR results normal per EPIC review.

## 2016-08-31 DIAGNOSIS — Z6841 Body Mass Index (BMI) 40.0 and over, adult: Secondary | ICD-10-CM | POA: Insufficient documentation

## 2016-09-06 DIAGNOSIS — D696 Thrombocytopenia, unspecified: Secondary | ICD-10-CM | POA: Insufficient documentation

## 2017-03-22 DIAGNOSIS — M79671 Pain in right foot: Secondary | ICD-10-CM | POA: Diagnosis not present

## 2017-03-22 DIAGNOSIS — M19071 Primary osteoarthritis, right ankle and foot: Secondary | ICD-10-CM | POA: Diagnosis not present

## 2017-03-22 DIAGNOSIS — M25571 Pain in right ankle and joints of right foot: Secondary | ICD-10-CM | POA: Diagnosis not present

## 2017-03-23 DIAGNOSIS — S51002D Unspecified open wound of left elbow, subsequent encounter: Secondary | ICD-10-CM | POA: Diagnosis not present

## 2017-03-23 DIAGNOSIS — M25571 Pain in right ankle and joints of right foot: Secondary | ICD-10-CM | POA: Diagnosis not present

## 2017-03-23 DIAGNOSIS — S53105D Unspecified dislocation of left ulnohumeral joint, subsequent encounter: Secondary | ICD-10-CM | POA: Diagnosis not present

## 2017-03-23 DIAGNOSIS — M25612 Stiffness of left shoulder, not elsewhere classified: Secondary | ICD-10-CM | POA: Diagnosis not present

## 2017-03-23 DIAGNOSIS — M545 Low back pain: Secondary | ICD-10-CM | POA: Diagnosis not present

## 2017-03-23 DIAGNOSIS — D693 Immune thrombocytopenic purpura: Secondary | ICD-10-CM | POA: Diagnosis not present

## 2017-03-23 DIAGNOSIS — M25512 Pain in left shoulder: Secondary | ICD-10-CM | POA: Diagnosis not present

## 2017-03-23 DIAGNOSIS — M25671 Stiffness of right ankle, not elsewhere classified: Secondary | ICD-10-CM | POA: Diagnosis not present

## 2017-03-23 DIAGNOSIS — S42292D Other displaced fracture of upper end of left humerus, subsequent encounter for fracture with routine healing: Secondary | ICD-10-CM | POA: Diagnosis not present

## 2017-03-25 DIAGNOSIS — M25512 Pain in left shoulder: Secondary | ICD-10-CM | POA: Diagnosis not present

## 2017-03-25 DIAGNOSIS — M25612 Stiffness of left shoulder, not elsewhere classified: Secondary | ICD-10-CM | POA: Diagnosis not present

## 2017-03-25 DIAGNOSIS — M25571 Pain in right ankle and joints of right foot: Secondary | ICD-10-CM | POA: Diagnosis not present

## 2017-03-25 DIAGNOSIS — M545 Low back pain: Secondary | ICD-10-CM | POA: Diagnosis not present

## 2017-03-25 DIAGNOSIS — M25671 Stiffness of right ankle, not elsewhere classified: Secondary | ICD-10-CM | POA: Diagnosis not present

## 2017-03-30 DIAGNOSIS — M545 Low back pain: Secondary | ICD-10-CM | POA: Diagnosis not present

## 2017-03-30 DIAGNOSIS — M25512 Pain in left shoulder: Secondary | ICD-10-CM | POA: Diagnosis not present

## 2017-03-30 DIAGNOSIS — M25671 Stiffness of right ankle, not elsewhere classified: Secondary | ICD-10-CM | POA: Diagnosis not present

## 2017-03-30 DIAGNOSIS — D693 Immune thrombocytopenic purpura: Secondary | ICD-10-CM | POA: Diagnosis not present

## 2017-03-30 DIAGNOSIS — M25571 Pain in right ankle and joints of right foot: Secondary | ICD-10-CM | POA: Diagnosis not present

## 2017-03-30 DIAGNOSIS — M25612 Stiffness of left shoulder, not elsewhere classified: Secondary | ICD-10-CM | POA: Diagnosis not present

## 2017-03-30 DIAGNOSIS — D509 Iron deficiency anemia, unspecified: Secondary | ICD-10-CM | POA: Diagnosis not present

## 2017-03-30 DIAGNOSIS — Z8639 Personal history of other endocrine, nutritional and metabolic disease: Secondary | ICD-10-CM | POA: Diagnosis not present

## 2017-04-06 DIAGNOSIS — M25571 Pain in right ankle and joints of right foot: Secondary | ICD-10-CM | POA: Diagnosis not present

## 2017-04-06 DIAGNOSIS — M25671 Stiffness of right ankle, not elsewhere classified: Secondary | ICD-10-CM | POA: Diagnosis not present

## 2017-04-06 DIAGNOSIS — M25512 Pain in left shoulder: Secondary | ICD-10-CM | POA: Diagnosis not present

## 2017-04-06 DIAGNOSIS — Z8639 Personal history of other endocrine, nutritional and metabolic disease: Secondary | ICD-10-CM | POA: Diagnosis not present

## 2017-04-06 DIAGNOSIS — M25612 Stiffness of left shoulder, not elsewhere classified: Secondary | ICD-10-CM | POA: Diagnosis not present

## 2017-04-06 DIAGNOSIS — D693 Immune thrombocytopenic purpura: Secondary | ICD-10-CM | POA: Diagnosis not present

## 2017-04-06 DIAGNOSIS — M545 Low back pain: Secondary | ICD-10-CM | POA: Diagnosis not present

## 2017-04-06 DIAGNOSIS — D509 Iron deficiency anemia, unspecified: Secondary | ICD-10-CM | POA: Diagnosis not present

## 2017-04-10 ENCOUNTER — Encounter: Payer: Self-pay | Admitting: Internal Medicine

## 2017-04-10 ENCOUNTER — Other Ambulatory Visit: Payer: Self-pay | Admitting: Internal Medicine

## 2017-04-10 DIAGNOSIS — F1111 Opioid abuse, in remission: Secondary | ICD-10-CM

## 2017-04-10 DIAGNOSIS — D696 Thrombocytopenia, unspecified: Secondary | ICD-10-CM

## 2017-04-10 DIAGNOSIS — G8929 Other chronic pain: Secondary | ICD-10-CM | POA: Insufficient documentation

## 2017-04-10 DIAGNOSIS — D509 Iron deficiency anemia, unspecified: Secondary | ICD-10-CM | POA: Insufficient documentation

## 2017-04-10 DIAGNOSIS — G894 Chronic pain syndrome: Secondary | ICD-10-CM

## 2017-04-10 DIAGNOSIS — D508 Other iron deficiency anemias: Secondary | ICD-10-CM

## 2017-04-10 DIAGNOSIS — I6200 Nontraumatic subdural hemorrhage, unspecified: Secondary | ICD-10-CM | POA: Insufficient documentation

## 2017-04-10 DIAGNOSIS — F1021 Alcohol dependence, in remission: Secondary | ICD-10-CM

## 2017-04-10 DIAGNOSIS — E559 Vitamin D deficiency, unspecified: Secondary | ICD-10-CM

## 2017-04-10 DIAGNOSIS — G40909 Epilepsy, unspecified, not intractable, without status epilepticus: Secondary | ICD-10-CM

## 2017-04-12 DIAGNOSIS — D509 Iron deficiency anemia, unspecified: Secondary | ICD-10-CM | POA: Diagnosis not present

## 2017-04-12 DIAGNOSIS — Z8639 Personal history of other endocrine, nutritional and metabolic disease: Secondary | ICD-10-CM | POA: Diagnosis not present

## 2017-04-12 DIAGNOSIS — D693 Immune thrombocytopenic purpura: Secondary | ICD-10-CM | POA: Diagnosis not present

## 2017-04-13 DIAGNOSIS — M25671 Stiffness of right ankle, not elsewhere classified: Secondary | ICD-10-CM | POA: Diagnosis not present

## 2017-04-13 DIAGNOSIS — M79602 Pain in left arm: Secondary | ICD-10-CM | POA: Diagnosis not present

## 2017-04-13 DIAGNOSIS — M545 Low back pain: Secondary | ICD-10-CM | POA: Diagnosis not present

## 2017-04-13 DIAGNOSIS — G8929 Other chronic pain: Secondary | ICD-10-CM | POA: Diagnosis not present

## 2017-04-13 DIAGNOSIS — G894 Chronic pain syndrome: Secondary | ICD-10-CM | POA: Diagnosis not present

## 2017-04-13 DIAGNOSIS — M25612 Stiffness of left shoulder, not elsewhere classified: Secondary | ICD-10-CM | POA: Diagnosis not present

## 2017-04-13 DIAGNOSIS — M25512 Pain in left shoulder: Secondary | ICD-10-CM | POA: Diagnosis not present

## 2017-04-13 DIAGNOSIS — M25571 Pain in right ankle and joints of right foot: Secondary | ICD-10-CM | POA: Diagnosis not present

## 2017-04-13 DIAGNOSIS — Z981 Arthrodesis status: Secondary | ICD-10-CM | POA: Diagnosis not present

## 2017-04-14 ENCOUNTER — Encounter: Payer: Self-pay | Admitting: Internal Medicine

## 2017-04-14 ENCOUNTER — Ambulatory Visit (INDEPENDENT_AMBULATORY_CARE_PROVIDER_SITE_OTHER): Payer: Medicare Other | Admitting: Internal Medicine

## 2017-04-14 VITALS — BP 112/66 | HR 104 | Ht 62.0 in | Wt 231.2 lb

## 2017-04-14 DIAGNOSIS — I10 Essential (primary) hypertension: Secondary | ICD-10-CM

## 2017-04-14 DIAGNOSIS — Z9884 Bariatric surgery status: Secondary | ICD-10-CM | POA: Diagnosis not present

## 2017-04-14 DIAGNOSIS — D693 Immune thrombocytopenic purpura: Secondary | ICD-10-CM | POA: Diagnosis not present

## 2017-04-14 DIAGNOSIS — N951 Menopausal and female climacteric states: Secondary | ICD-10-CM
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
## 2017-04-15 DIAGNOSIS — M25571 Pain in right ankle and joints of right foot: Secondary | ICD-10-CM | POA: Diagnosis not present

## 2017-04-15 DIAGNOSIS — M545 Low back pain: Secondary | ICD-10-CM | POA: Diagnosis not present

## 2017-04-15 DIAGNOSIS — M25512 Pain in left shoulder: Secondary | ICD-10-CM | POA: Diagnosis not present

## 2017-04-15 DIAGNOSIS — M25612 Stiffness of left shoulder, not elsewhere classified: Secondary | ICD-10-CM | POA: Diagnosis not present

## 2017-04-15 DIAGNOSIS — M25671 Stiffness of right ankle, not elsewhere classified: Secondary | ICD-10-CM | POA: Diagnosis not present

## 2017-04-21 DIAGNOSIS — D693 Immune thrombocytopenic purpura: Secondary | ICD-10-CM | POA: Diagnosis not present

## 2017-04-21 DIAGNOSIS — S32011A Stable burst fracture of first lumbar vertebra, initial encounter for closed fracture: Secondary | ICD-10-CM | POA: Diagnosis not present

## 2017-04-21 DIAGNOSIS — Y33XXXA Other specified events, undetermined intent, initial encounter: Secondary | ICD-10-CM | POA: Diagnosis not present

## 2017-04-21 DIAGNOSIS — S22081D Stable burst fracture of T11-T12 vertebra, subsequent encounter for fracture with routine healing: Secondary | ICD-10-CM | POA: Diagnosis not present

## 2017-04-21 DIAGNOSIS — S32001D Stable burst fracture of unspecified lumbar vertebra, subsequent encounter for fracture with routine healing: Secondary | ICD-10-CM | POA: Diagnosis not present

## 2017-04-21 DIAGNOSIS — M4856XA Collapsed vertebra, not elsewhere classified, lumbar region, initial encounter for fracture: Secondary | ICD-10-CM | POA: Diagnosis not present

## 2017-04-21 DIAGNOSIS — S32011D Stable burst fracture of first lumbar vertebra, subsequent encounter for fracture with routine healing: Secondary | ICD-10-CM | POA: Diagnosis not present

## 2017-04-21 DIAGNOSIS — S22009D Unspecified fracture of unspecified thoracic vertebra, subsequent encounter for fracture with routine healing: Secondary | ICD-10-CM | POA: Diagnosis not present

## 2017-04-27 DIAGNOSIS — M545 Low back pain: Secondary | ICD-10-CM | POA: Diagnosis not present

## 2017-04-27 DIAGNOSIS — M25512 Pain in left shoulder: Secondary | ICD-10-CM | POA: Diagnosis not present

## 2017-04-27 DIAGNOSIS — Z8639 Personal history of other endocrine, nutritional and metabolic disease: Secondary | ICD-10-CM | POA: Diagnosis not present

## 2017-04-27 DIAGNOSIS — M25612 Stiffness of left shoulder, not elsewhere classified: Secondary | ICD-10-CM | POA: Diagnosis not present

## 2017-04-27 DIAGNOSIS — M25571 Pain in right ankle and joints of right foot: Secondary | ICD-10-CM | POA: Diagnosis not present

## 2017-04-27 DIAGNOSIS — D509 Iron deficiency anemia, unspecified: Secondary | ICD-10-CM | POA: Diagnosis not present

## 2017-04-27 DIAGNOSIS — D500 Iron deficiency anemia secondary to blood loss (chronic): Secondary | ICD-10-CM | POA: Diagnosis not present

## 2017-04-27 DIAGNOSIS — M25671 Stiffness of right ankle, not elsewhere classified: Secondary | ICD-10-CM | POA: Diagnosis not present

## 2017-04-27 DIAGNOSIS — D693 Immune thrombocytopenic purpura: Secondary | ICD-10-CM | POA: Diagnosis not present

## 2017-05-04 DIAGNOSIS — D509 Iron deficiency anemia, unspecified: Secondary | ICD-10-CM | POA: Diagnosis not present

## 2017-05-04 DIAGNOSIS — M25671 Stiffness of right ankle, not elsewhere classified: Secondary | ICD-10-CM | POA: Diagnosis not present

## 2017-05-04 DIAGNOSIS — M545 Low back pain: Secondary | ICD-10-CM | POA: Diagnosis not present

## 2017-05-04 DIAGNOSIS — M25612 Stiffness of left shoulder, not elsewhere classified: Secondary | ICD-10-CM | POA: Diagnosis not present

## 2017-05-04 DIAGNOSIS — Z8639 Personal history of other endocrine, nutritional and metabolic disease: Secondary | ICD-10-CM | POA: Diagnosis not present

## 2017-05-04 DIAGNOSIS — M25512 Pain in left shoulder: Secondary | ICD-10-CM | POA: Diagnosis not present

## 2017-05-04 DIAGNOSIS — M25571 Pain in right ankle and joints of right foot: Secondary | ICD-10-CM | POA: Diagnosis not present

## 2017-05-04 DIAGNOSIS — D693 Immune thrombocytopenic purpura: Secondary | ICD-10-CM | POA: Diagnosis not present

## 2017-05-05 ENCOUNTER — Encounter: Payer: Medicare Other | Admitting: Internal Medicine

## 2017-05-11 DIAGNOSIS — D693 Immune thrombocytopenic purpura: Secondary | ICD-10-CM | POA: Diagnosis not present

## 2017-05-11 DIAGNOSIS — Z8639 Personal history of other endocrine, nutritional and metabolic disease: Secondary | ICD-10-CM | POA: Diagnosis not present

## 2017-05-11 DIAGNOSIS — D509 Iron deficiency anemia, unspecified: Secondary | ICD-10-CM | POA: Diagnosis not present

## 2017-05-17 DIAGNOSIS — D693 Immune thrombocytopenic purpura: Secondary | ICD-10-CM | POA: Diagnosis not present

## 2017-05-17 DIAGNOSIS — D509 Iron deficiency anemia, unspecified: Secondary | ICD-10-CM | POA: Diagnosis not present

## 2017-05-17 DIAGNOSIS — M25612 Stiffness of left shoulder, not elsewhere classified: Secondary | ICD-10-CM | POA: Diagnosis not present

## 2017-05-17 DIAGNOSIS — M25671 Stiffness of right ankle, not elsewhere classified: Secondary | ICD-10-CM | POA: Diagnosis not present

## 2017-05-17 DIAGNOSIS — Z8639 Personal history of other endocrine, nutritional and metabolic disease: Secondary | ICD-10-CM | POA: Diagnosis not present

## 2017-05-17 DIAGNOSIS — M25571 Pain in right ankle and joints of right foot: Secondary | ICD-10-CM | POA: Diagnosis not present

## 2017-05-17 DIAGNOSIS — M25512 Pain in left shoulder: Secondary | ICD-10-CM | POA: Diagnosis not present

## 2017-05-17 DIAGNOSIS — M545 Low back pain: Secondary | ICD-10-CM | POA: Diagnosis not present

## 2017-05-25 DIAGNOSIS — D509 Iron deficiency anemia, unspecified: Secondary | ICD-10-CM | POA: Diagnosis not present

## 2017-05-25 DIAGNOSIS — G894 Chronic pain syndrome: Secondary | ICD-10-CM | POA: Diagnosis not present

## 2017-05-25 DIAGNOSIS — Z981 Arthrodesis status: Secondary | ICD-10-CM | POA: Diagnosis not present

## 2017-05-25 DIAGNOSIS — Z87891 Personal history of nicotine dependence: Secondary | ICD-10-CM | POA: Diagnosis not present

## 2017-05-25 DIAGNOSIS — M79602 Pain in left arm: Secondary | ICD-10-CM | POA: Diagnosis not present

## 2017-05-25 DIAGNOSIS — Z79899 Other long term (current) drug therapy: Secondary | ICD-10-CM | POA: Diagnosis not present

## 2017-05-25 DIAGNOSIS — G8929 Other chronic pain: Secondary | ICD-10-CM | POA: Diagnosis not present

## 2017-05-25 DIAGNOSIS — Z8639 Personal history of other endocrine, nutritional and metabolic disease: Secondary | ICD-10-CM | POA: Diagnosis not present

## 2017-05-25 DIAGNOSIS — M25571 Pain in right ankle and joints of right foot: Secondary | ICD-10-CM | POA: Diagnosis not present

## 2017-05-25 DIAGNOSIS — Z9884 Bariatric surgery status: Secondary | ICD-10-CM | POA: Diagnosis not present

## 2017-05-25 DIAGNOSIS — D693 Immune thrombocytopenic purpura: Secondary | ICD-10-CM | POA: Diagnosis not present

## 2017-06-07 DIAGNOSIS — M25612 Stiffness of left shoulder, not elsewhere classified: Secondary | ICD-10-CM | POA: Diagnosis not present

## 2017-06-07 DIAGNOSIS — D500 Iron deficiency anemia secondary to blood loss (chronic): Secondary | ICD-10-CM | POA: Diagnosis not present

## 2017-06-07 DIAGNOSIS — M545 Low back pain: Secondary | ICD-10-CM | POA: Diagnosis not present

## 2017-06-07 DIAGNOSIS — M25571 Pain in right ankle and joints of right foot: Secondary | ICD-10-CM | POA: Diagnosis not present

## 2017-06-07 DIAGNOSIS — M25671 Stiffness of right ankle, not elsewhere classified: Secondary | ICD-10-CM | POA: Diagnosis not present

## 2017-06-07 DIAGNOSIS — M25512 Pain in left shoulder: Secondary | ICD-10-CM | POA: Diagnosis not present

## 2017-06-14 DIAGNOSIS — D693 Immune thrombocytopenic purpura: Secondary | ICD-10-CM | POA: Diagnosis not present

## 2017-06-21 DIAGNOSIS — D693 Immune thrombocytopenic purpura: Secondary | ICD-10-CM | POA: Diagnosis not present

## 2017-06-21 DIAGNOSIS — Z8639 Personal history of other endocrine, nutritional and metabolic disease: Secondary | ICD-10-CM | POA: Diagnosis not present

## 2017-06-21 DIAGNOSIS — D509 Iron deficiency anemia, unspecified: Secondary | ICD-10-CM | POA: Diagnosis not present

## 2017-06-28 DIAGNOSIS — D693 Immune thrombocytopenic purpura: Secondary | ICD-10-CM | POA: Diagnosis not present

## 2017-06-28 DIAGNOSIS — Z8639 Personal history of other endocrine, nutritional and metabolic disease: Secondary | ICD-10-CM | POA: Diagnosis not present

## 2017-07-01 DIAGNOSIS — D693 Immune thrombocytopenic purpura: Secondary | ICD-10-CM | POA: Diagnosis not present

## 2017-07-02 DIAGNOSIS — H109 Unspecified conjunctivitis: Secondary | ICD-10-CM | POA: Diagnosis not present

## 2017-07-02 DIAGNOSIS — Z1231 Encounter for screening mammogram for malignant neoplasm of breast: Secondary | ICD-10-CM | POA: Diagnosis not present

## 2017-07-05 DIAGNOSIS — S22009D Unspecified fracture of unspecified thoracic vertebra, subsequent encounter for fracture with routine healing: Secondary | ICD-10-CM | POA: Diagnosis not present

## 2017-07-05 DIAGNOSIS — D693 Immune thrombocytopenic purpura: Secondary | ICD-10-CM | POA: Diagnosis not present

## 2017-07-05 DIAGNOSIS — M545 Low back pain: Secondary | ICD-10-CM | POA: Diagnosis not present

## 2017-07-05 DIAGNOSIS — S32001D Stable burst fracture of unspecified lumbar vertebra, subsequent encounter for fracture with routine healing: Secondary | ICD-10-CM | POA: Diagnosis not present

## 2017-07-07 DIAGNOSIS — S32019A Unspecified fracture of first lumbar vertebra, initial encounter for closed fracture: Secondary | ICD-10-CM | POA: Diagnosis not present

## 2017-07-07 DIAGNOSIS — S32019D Unspecified fracture of first lumbar vertebra, subsequent encounter for fracture with routine healing: Secondary | ICD-10-CM | POA: Diagnosis not present

## 2017-07-14 DIAGNOSIS — M5440 Lumbago with sciatica, unspecified side: Secondary | ICD-10-CM | POA: Diagnosis not present

## 2017-07-14 DIAGNOSIS — D693 Immune thrombocytopenic purpura: Secondary | ICD-10-CM | POA: Diagnosis not present

## 2017-07-14 DIAGNOSIS — Z8639 Personal history of other endocrine, nutritional and metabolic disease: Secondary | ICD-10-CM | POA: Diagnosis not present

## 2017-07-14 DIAGNOSIS — D509 Iron deficiency anemia, unspecified: Secondary | ICD-10-CM | POA: Diagnosis not present

## 2017-07-18 DIAGNOSIS — M545 Low back pain, unspecified: Secondary | ICD-10-CM | POA: Insufficient documentation

## 2017-07-21 DIAGNOSIS — G894 Chronic pain syndrome: Secondary | ICD-10-CM | POA: Diagnosis not present

## 2017-07-21 DIAGNOSIS — Z981 Arthrodesis status: Secondary | ICD-10-CM | POA: Diagnosis not present

## 2017-07-21 DIAGNOSIS — Z8639 Personal history of other endocrine, nutritional and metabolic disease: Secondary | ICD-10-CM | POA: Diagnosis not present

## 2017-07-21 DIAGNOSIS — M79602 Pain in left arm: Secondary | ICD-10-CM | POA: Diagnosis not present

## 2017-07-21 DIAGNOSIS — M25571 Pain in right ankle and joints of right foot: Secondary | ICD-10-CM | POA: Diagnosis not present

## 2017-07-21 DIAGNOSIS — D509 Iron deficiency anemia, unspecified: Secondary | ICD-10-CM | POA: Diagnosis not present

## 2017-07-21 DIAGNOSIS — M545 Low back pain: Secondary | ICD-10-CM | POA: Diagnosis not present

## 2017-07-21 DIAGNOSIS — Z79891 Long term (current) use of opiate analgesic: Secondary | ICD-10-CM | POA: Diagnosis not present

## 2017-07-21 DIAGNOSIS — D693 Immune thrombocytopenic purpura: Secondary | ICD-10-CM | POA: Diagnosis not present

## 2017-07-21 DIAGNOSIS — G8929 Other chronic pain: Secondary | ICD-10-CM | POA: Diagnosis not present

## 2017-07-26 DIAGNOSIS — Z8639 Personal history of other endocrine, nutritional and metabolic disease: Secondary | ICD-10-CM | POA: Diagnosis not present

## 2017-07-26 DIAGNOSIS — D693 Immune thrombocytopenic purpura: Secondary | ICD-10-CM | POA: Diagnosis not present

## 2017-08-02 DIAGNOSIS — D509 Iron deficiency anemia, unspecified: Secondary | ICD-10-CM | POA: Diagnosis not present

## 2017-08-02 DIAGNOSIS — Z1231 Encounter for screening mammogram for malignant neoplasm of breast: Secondary | ICD-10-CM | POA: Diagnosis not present

## 2017-08-02 DIAGNOSIS — D693 Immune thrombocytopenic purpura: Secondary | ICD-10-CM | POA: Diagnosis not present

## 2017-08-09 DIAGNOSIS — D693 Immune thrombocytopenic purpura: Secondary | ICD-10-CM | POA: Diagnosis not present

## 2017-08-09 DIAGNOSIS — D509 Iron deficiency anemia, unspecified: Secondary | ICD-10-CM | POA: Diagnosis not present

## 2017-08-16 DIAGNOSIS — D693 Immune thrombocytopenic purpura: Secondary | ICD-10-CM | POA: Diagnosis not present

## 2017-08-16 DIAGNOSIS — D509 Iron deficiency anemia, unspecified: Secondary | ICD-10-CM | POA: Diagnosis not present

## 2017-08-23 DIAGNOSIS — M19071 Primary osteoarthritis, right ankle and foot: Secondary | ICD-10-CM | POA: Diagnosis not present

## 2017-08-23 DIAGNOSIS — M810 Age-related osteoporosis without current pathological fracture: Secondary | ICD-10-CM | POA: Diagnosis not present

## 2017-08-23 DIAGNOSIS — Z981 Arthrodesis status: Secondary | ICD-10-CM | POA: Diagnosis not present

## 2017-08-23 DIAGNOSIS — Z87891 Personal history of nicotine dependence: Secondary | ICD-10-CM | POA: Diagnosis not present

## 2017-08-23 DIAGNOSIS — S93314D Dislocation of tarsal joint of right foot, subsequent encounter: Secondary | ICD-10-CM | POA: Diagnosis not present

## 2017-08-23 DIAGNOSIS — M7989 Other specified soft tissue disorders: Secondary | ICD-10-CM | POA: Diagnosis not present

## 2017-08-23 DIAGNOSIS — M21961 Unspecified acquired deformity of right lower leg: Secondary | ICD-10-CM | POA: Diagnosis not present

## 2017-08-30 DIAGNOSIS — D693 Immune thrombocytopenic purpura: Secondary | ICD-10-CM | POA: Diagnosis not present

## 2017-09-06 DIAGNOSIS — D509 Iron deficiency anemia, unspecified: Secondary | ICD-10-CM | POA: Diagnosis not present

## 2017-09-06 DIAGNOSIS — D693 Immune thrombocytopenic purpura: Secondary | ICD-10-CM | POA: Diagnosis not present

## 2017-09-13 DIAGNOSIS — D693 Immune thrombocytopenic purpura: Secondary | ICD-10-CM | POA: Diagnosis not present

## 2017-09-15 DIAGNOSIS — M79602 Pain in left arm: Secondary | ICD-10-CM | POA: Diagnosis not present

## 2017-09-15 DIAGNOSIS — G894 Chronic pain syndrome: Secondary | ICD-10-CM | POA: Diagnosis not present

## 2017-09-15 DIAGNOSIS — G8929 Other chronic pain: Secondary | ICD-10-CM | POA: Diagnosis not present

## 2017-09-15 DIAGNOSIS — Z981 Arthrodesis status: Secondary | ICD-10-CM | POA: Diagnosis not present

## 2017-09-15 DIAGNOSIS — D693 Immune thrombocytopenic purpura: Secondary | ICD-10-CM | POA: Diagnosis not present

## 2017-09-15 DIAGNOSIS — M25571 Pain in right ankle and joints of right foot: Secondary | ICD-10-CM | POA: Diagnosis not present

## 2017-09-20 DIAGNOSIS — D693 Immune thrombocytopenic purpura: Secondary | ICD-10-CM | POA: Diagnosis not present

## 2017-09-27 DIAGNOSIS — D509 Iron deficiency anemia, unspecified: Secondary | ICD-10-CM | POA: Diagnosis not present

## 2017-09-27 DIAGNOSIS — D693 Immune thrombocytopenic purpura: Secondary | ICD-10-CM | POA: Diagnosis not present

## 2017-10-11 DIAGNOSIS — D693 Immune thrombocytopenic purpura: Secondary | ICD-10-CM | POA: Diagnosis not present

## 2017-10-11 DIAGNOSIS — D509 Iron deficiency anemia, unspecified: Secondary | ICD-10-CM | POA: Diagnosis not present

## 2017-10-18 DIAGNOSIS — D509 Iron deficiency anemia, unspecified: Secondary | ICD-10-CM | POA: Diagnosis not present

## 2017-10-18 DIAGNOSIS — D500 Iron deficiency anemia secondary to blood loss (chronic): Secondary | ICD-10-CM | POA: Diagnosis not present

## 2017-10-18 DIAGNOSIS — D693 Immune thrombocytopenic purpura: Secondary | ICD-10-CM | POA: Diagnosis not present

## 2017-10-18 DIAGNOSIS — E559 Vitamin D deficiency, unspecified: Secondary | ICD-10-CM | POA: Diagnosis not present

## 2017-10-18 DIAGNOSIS — I10 Essential (primary) hypertension: Secondary | ICD-10-CM | POA: Diagnosis not present

## 2017-10-19 DIAGNOSIS — Z79891 Long term (current) use of opiate analgesic: Secondary | ICD-10-CM | POA: Diagnosis not present

## 2017-10-19 DIAGNOSIS — G894 Chronic pain syndrome: Secondary | ICD-10-CM | POA: Diagnosis not present

## 2017-10-19 DIAGNOSIS — M79602 Pain in left arm: Secondary | ICD-10-CM | POA: Diagnosis not present

## 2017-10-19 DIAGNOSIS — M25571 Pain in right ankle and joints of right foot: Secondary | ICD-10-CM | POA: Diagnosis not present

## 2017-10-19 DIAGNOSIS — Z981 Arthrodesis status: Secondary | ICD-10-CM | POA: Diagnosis not present

## 2017-10-19 DIAGNOSIS — G8929 Other chronic pain: Secondary | ICD-10-CM | POA: Diagnosis not present

## 2017-10-25 DIAGNOSIS — D693 Immune thrombocytopenic purpura: Secondary | ICD-10-CM | POA: Diagnosis not present

## 2017-10-25 DIAGNOSIS — D509 Iron deficiency anemia, unspecified: Secondary | ICD-10-CM | POA: Diagnosis not present

## 2017-11-03 DIAGNOSIS — D693 Immune thrombocytopenic purpura: Secondary | ICD-10-CM | POA: Diagnosis not present

## 2017-11-03 DIAGNOSIS — R748 Abnormal levels of other serum enzymes: Secondary | ICD-10-CM | POA: Diagnosis not present

## 2017-11-09 DIAGNOSIS — D693 Immune thrombocytopenic purpura: Secondary | ICD-10-CM | POA: Diagnosis not present

## 2017-11-09 DIAGNOSIS — D509 Iron deficiency anemia, unspecified: Secondary | ICD-10-CM | POA: Diagnosis not present

## 2017-11-12 DIAGNOSIS — D693 Immune thrombocytopenic purpura: Secondary | ICD-10-CM | POA: Diagnosis not present

## 2017-11-17 DIAGNOSIS — M79602 Pain in left arm: Secondary | ICD-10-CM | POA: Diagnosis not present

## 2017-11-17 DIAGNOSIS — G8929 Other chronic pain: Secondary | ICD-10-CM | POA: Diagnosis not present

## 2017-11-17 DIAGNOSIS — G894 Chronic pain syndrome: Secondary | ICD-10-CM | POA: Diagnosis not present

## 2017-11-17 DIAGNOSIS — Z79891 Long term (current) use of opiate analgesic: Secondary | ICD-10-CM | POA: Diagnosis not present

## 2017-11-17 DIAGNOSIS — Z981 Arthrodesis status: Secondary | ICD-10-CM | POA: Diagnosis not present

## 2017-11-17 DIAGNOSIS — M545 Low back pain: Secondary | ICD-10-CM | POA: Diagnosis not present

## 2017-11-17 DIAGNOSIS — M25571 Pain in right ankle and joints of right foot: Secondary | ICD-10-CM | POA: Diagnosis not present

## 2017-11-19 DIAGNOSIS — D693 Immune thrombocytopenic purpura: Secondary | ICD-10-CM | POA: Diagnosis not present

## 2017-11-25 DIAGNOSIS — M19071 Primary osteoarthritis, right ankle and foot: Secondary | ICD-10-CM | POA: Diagnosis not present

## 2017-11-26 DIAGNOSIS — D500 Iron deficiency anemia secondary to blood loss (chronic): Secondary | ICD-10-CM | POA: Diagnosis not present

## 2017-11-26 DIAGNOSIS — D693 Immune thrombocytopenic purpura: Secondary | ICD-10-CM | POA: Diagnosis not present

## 2017-11-29 DIAGNOSIS — M19071 Primary osteoarthritis, right ankle and foot: Secondary | ICD-10-CM | POA: Diagnosis not present

## 2017-12-06 DIAGNOSIS — D693 Immune thrombocytopenic purpura: Secondary | ICD-10-CM | POA: Diagnosis not present

## 2017-12-13 DIAGNOSIS — Z95828 Presence of other vascular implants and grafts: Secondary | ICD-10-CM | POA: Diagnosis not present

## 2017-12-13 DIAGNOSIS — D693 Immune thrombocytopenic purpura: Secondary | ICD-10-CM | POA: Diagnosis not present

## 2017-12-14 DIAGNOSIS — R748 Abnormal levels of other serum enzymes: Secondary | ICD-10-CM | POA: Diagnosis not present

## 2017-12-14 DIAGNOSIS — K760 Fatty (change of) liver, not elsewhere classified: Secondary | ICD-10-CM | POA: Diagnosis not present

## 2017-12-14 DIAGNOSIS — Z87891 Personal history of nicotine dependence: Secondary | ICD-10-CM | POA: Diagnosis not present

## 2017-12-14 DIAGNOSIS — Z23 Encounter for immunization: Secondary | ICD-10-CM | POA: Diagnosis not present

## 2017-12-14 DIAGNOSIS — R740 Nonspecific elevation of levels of transaminase and lactic acid dehydrogenase [LDH]: Secondary | ICD-10-CM | POA: Diagnosis not present

## 2017-12-21 DIAGNOSIS — R945 Abnormal results of liver function studies: Secondary | ICD-10-CM | POA: Diagnosis not present

## 2017-12-21 DIAGNOSIS — D693 Immune thrombocytopenic purpura: Secondary | ICD-10-CM | POA: Diagnosis not present

## 2017-12-21 DIAGNOSIS — Z713 Dietary counseling and surveillance: Secondary | ICD-10-CM | POA: Diagnosis not present

## 2017-12-21 DIAGNOSIS — Z95828 Presence of other vascular implants and grafts: Secondary | ICD-10-CM | POA: Diagnosis not present

## 2017-12-21 DIAGNOSIS — D500 Iron deficiency anemia secondary to blood loss (chronic): Secondary | ICD-10-CM | POA: Diagnosis not present

## 2017-12-21 DIAGNOSIS — K760 Fatty (change of) liver, not elsewhere classified: Secondary | ICD-10-CM | POA: Diagnosis not present

## 2017-12-22 DIAGNOSIS — K838 Other specified diseases of biliary tract: Secondary | ICD-10-CM | POA: Diagnosis not present

## 2017-12-22 DIAGNOSIS — K760 Fatty (change of) liver, not elsewhere classified: Secondary | ICD-10-CM | POA: Diagnosis not present

## 2017-12-22 DIAGNOSIS — Z9884 Bariatric surgery status: Secondary | ICD-10-CM | POA: Diagnosis not present

## 2017-12-22 DIAGNOSIS — R748 Abnormal levels of other serum enzymes: Secondary | ICD-10-CM | POA: Diagnosis not present

## 2017-12-29 DIAGNOSIS — M19171 Post-traumatic osteoarthritis, right ankle and foot: Secondary | ICD-10-CM | POA: Diagnosis not present

## 2017-12-29 DIAGNOSIS — G8929 Other chronic pain: Secondary | ICD-10-CM | POA: Diagnosis not present

## 2017-12-29 DIAGNOSIS — M79602 Pain in left arm: Secondary | ICD-10-CM | POA: Diagnosis not present

## 2017-12-29 DIAGNOSIS — M25571 Pain in right ankle and joints of right foot: Secondary | ICD-10-CM | POA: Diagnosis not present

## 2017-12-29 DIAGNOSIS — M545 Low back pain: Secondary | ICD-10-CM | POA: Diagnosis not present

## 2017-12-29 DIAGNOSIS — G894 Chronic pain syndrome: Secondary | ICD-10-CM | POA: Diagnosis not present

## 2017-12-29 DIAGNOSIS — Z981 Arthrodesis status: Secondary | ICD-10-CM | POA: Diagnosis not present

## 2017-12-30 DIAGNOSIS — Z0000 Encounter for general adult medical examination without abnormal findings: Secondary | ICD-10-CM | POA: Diagnosis not present

## 2017-12-30 DIAGNOSIS — Z915 Personal history of self-harm: Secondary | ICD-10-CM | POA: Insufficient documentation

## 2017-12-30 DIAGNOSIS — Z124 Encounter for screening for malignant neoplasm of cervix: Secondary | ICD-10-CM | POA: Diagnosis not present

## 2017-12-30 DIAGNOSIS — Z79899 Other long term (current) drug therapy: Secondary | ICD-10-CM | POA: Diagnosis not present

## 2017-12-30 DIAGNOSIS — Z95828 Presence of other vascular implants and grafts: Secondary | ICD-10-CM | POA: Diagnosis not present

## 2017-12-30 DIAGNOSIS — E559 Vitamin D deficiency, unspecified: Secondary | ICD-10-CM | POA: Diagnosis not present

## 2017-12-30 DIAGNOSIS — Z9884 Bariatric surgery status: Secondary | ICD-10-CM | POA: Diagnosis not present

## 2017-12-30 DIAGNOSIS — Z87891 Personal history of nicotine dependence: Secondary | ICD-10-CM | POA: Diagnosis not present

## 2017-12-30 DIAGNOSIS — D693 Immune thrombocytopenic purpura: Secondary | ICD-10-CM | POA: Diagnosis not present

## 2017-12-30 DIAGNOSIS — F1111 Opioid abuse, in remission: Secondary | ICD-10-CM | POA: Insufficient documentation

## 2017-12-30 DIAGNOSIS — Z9151 Personal history of suicidal behavior: Secondary | ICD-10-CM | POA: Insufficient documentation

## 2018-01-03 DIAGNOSIS — Z9884 Bariatric surgery status: Secondary | ICD-10-CM | POA: Diagnosis not present

## 2018-01-11 DIAGNOSIS — Z23 Encounter for immunization: Secondary | ICD-10-CM | POA: Diagnosis not present

## 2018-01-11 DIAGNOSIS — R7989 Other specified abnormal findings of blood chemistry: Secondary | ICD-10-CM | POA: Diagnosis not present

## 2018-01-11 DIAGNOSIS — Z87891 Personal history of nicotine dependence: Secondary | ICD-10-CM | POA: Diagnosis not present

## 2018-01-11 DIAGNOSIS — R000 Tachycardia, unspecified: Secondary | ICD-10-CM | POA: Diagnosis not present

## 2018-01-11 DIAGNOSIS — Z79899 Other long term (current) drug therapy: Secondary | ICD-10-CM | POA: Diagnosis not present

## 2018-01-12 DIAGNOSIS — M25571 Pain in right ankle and joints of right foot: Secondary | ICD-10-CM | POA: Diagnosis not present

## 2018-01-12 DIAGNOSIS — M545 Low back pain: Secondary | ICD-10-CM | POA: Diagnosis not present

## 2018-01-12 DIAGNOSIS — G894 Chronic pain syndrome: Secondary | ICD-10-CM | POA: Diagnosis not present

## 2018-01-12 DIAGNOSIS — Z981 Arthrodesis status: Secondary | ICD-10-CM | POA: Diagnosis not present

## 2018-01-12 DIAGNOSIS — M19171 Post-traumatic osteoarthritis, right ankle and foot: Secondary | ICD-10-CM | POA: Diagnosis not present

## 2018-01-12 DIAGNOSIS — M79602 Pain in left arm: Secondary | ICD-10-CM | POA: Diagnosis not present

## 2018-01-12 DIAGNOSIS — G8929 Other chronic pain: Secondary | ICD-10-CM | POA: Diagnosis not present

## 2018-01-14 DIAGNOSIS — K760 Fatty (change of) liver, not elsewhere classified: Secondary | ICD-10-CM | POA: Diagnosis not present

## 2018-01-18 DIAGNOSIS — D693 Immune thrombocytopenic purpura: Secondary | ICD-10-CM | POA: Diagnosis not present

## 2018-01-18 DIAGNOSIS — H53021 Refractive amblyopia, right eye: Secondary | ICD-10-CM | POA: Diagnosis not present

## 2018-01-18 DIAGNOSIS — Z8639 Personal history of other endocrine, nutritional and metabolic disease: Secondary | ICD-10-CM | POA: Diagnosis not present

## 2018-01-18 DIAGNOSIS — H5201 Hypermetropia, right eye: Secondary | ICD-10-CM | POA: Diagnosis not present

## 2018-01-18 DIAGNOSIS — H52221 Regular astigmatism, right eye: Secondary | ICD-10-CM | POA: Diagnosis not present

## 2018-01-18 DIAGNOSIS — Z95828 Presence of other vascular implants and grafts: Secondary | ICD-10-CM | POA: Diagnosis not present

## 2018-01-24 DIAGNOSIS — M654 Radial styloid tenosynovitis [de Quervain]: Secondary | ICD-10-CM | POA: Diagnosis not present

## 2018-01-24 DIAGNOSIS — M25531 Pain in right wrist: Secondary | ICD-10-CM | POA: Diagnosis not present

## 2018-02-04 DIAGNOSIS — Z95828 Presence of other vascular implants and grafts: Secondary | ICD-10-CM | POA: Diagnosis not present

## 2018-02-04 DIAGNOSIS — D693 Immune thrombocytopenic purpura: Secondary | ICD-10-CM | POA: Diagnosis not present

## 2018-02-04 DIAGNOSIS — M19071 Primary osteoarthritis, right ankle and foot: Secondary | ICD-10-CM | POA: Diagnosis not present

## 2018-02-09 DIAGNOSIS — M19171 Post-traumatic osteoarthritis, right ankle and foot: Secondary | ICD-10-CM | POA: Diagnosis not present

## 2018-02-09 DIAGNOSIS — Z79891 Long term (current) use of opiate analgesic: Secondary | ICD-10-CM | POA: Diagnosis not present

## 2018-02-09 DIAGNOSIS — G894 Chronic pain syndrome: Secondary | ICD-10-CM | POA: Diagnosis not present

## 2018-02-09 DIAGNOSIS — G8929 Other chronic pain: Secondary | ICD-10-CM | POA: Diagnosis not present

## 2018-02-09 DIAGNOSIS — M79602 Pain in left arm: Secondary | ICD-10-CM | POA: Diagnosis not present

## 2018-02-09 DIAGNOSIS — M545 Low back pain: Secondary | ICD-10-CM | POA: Diagnosis not present

## 2018-02-09 DIAGNOSIS — Z981 Arthrodesis status: Secondary | ICD-10-CM | POA: Diagnosis not present

## 2018-02-09 DIAGNOSIS — M25571 Pain in right ankle and joints of right foot: Secondary | ICD-10-CM | POA: Diagnosis not present

## 2018-02-24 DIAGNOSIS — Z95828 Presence of other vascular implants and grafts: Secondary | ICD-10-CM | POA: Diagnosis not present

## 2018-02-24 DIAGNOSIS — D693 Immune thrombocytopenic purpura: Secondary | ICD-10-CM | POA: Diagnosis not present

## 2018-03-01 DIAGNOSIS — D500 Iron deficiency anemia secondary to blood loss (chronic): Secondary | ICD-10-CM | POA: Diagnosis not present

## 2018-03-01 DIAGNOSIS — D693 Immune thrombocytopenic purpura: Secondary | ICD-10-CM | POA: Diagnosis not present

## 2018-03-03 DIAGNOSIS — I890 Lymphedema, not elsewhere classified: Secondary | ICD-10-CM | POA: Diagnosis not present

## 2018-03-08 DIAGNOSIS — G8929 Other chronic pain: Secondary | ICD-10-CM | POA: Diagnosis not present

## 2018-03-08 DIAGNOSIS — Z981 Arthrodesis status: Secondary | ICD-10-CM | POA: Diagnosis not present

## 2018-03-08 DIAGNOSIS — M25571 Pain in right ankle and joints of right foot: Secondary | ICD-10-CM | POA: Diagnosis not present

## 2018-03-08 DIAGNOSIS — M79602 Pain in left arm: Secondary | ICD-10-CM | POA: Diagnosis not present

## 2018-03-08 DIAGNOSIS — G894 Chronic pain syndrome: Secondary | ICD-10-CM | POA: Diagnosis not present

## 2018-03-08 DIAGNOSIS — M19171 Post-traumatic osteoarthritis, right ankle and foot: Secondary | ICD-10-CM | POA: Diagnosis not present

## 2018-03-08 DIAGNOSIS — M545 Low back pain: Secondary | ICD-10-CM | POA: Diagnosis not present

## 2018-03-23 DIAGNOSIS — R262 Difficulty in walking, not elsewhere classified: Secondary | ICD-10-CM | POA: Diagnosis not present

## 2018-03-23 DIAGNOSIS — D693 Immune thrombocytopenic purpura: Secondary | ICD-10-CM | POA: Diagnosis not present

## 2018-03-23 DIAGNOSIS — Z95828 Presence of other vascular implants and grafts: Secondary | ICD-10-CM | POA: Diagnosis not present

## 2018-03-29 DIAGNOSIS — R262 Difficulty in walking, not elsewhere classified: Secondary | ICD-10-CM | POA: Diagnosis not present

## 2018-04-04 DIAGNOSIS — M8580 Other specified disorders of bone density and structure, unspecified site: Secondary | ICD-10-CM | POA: Diagnosis not present

## 2018-04-04 DIAGNOSIS — M19071 Primary osteoarthritis, right ankle and foot: Secondary | ICD-10-CM | POA: Diagnosis not present

## 2018-04-04 DIAGNOSIS — M19171 Post-traumatic osteoarthritis, right ankle and foot: Secondary | ICD-10-CM | POA: Diagnosis not present

## 2018-04-04 DIAGNOSIS — Z981 Arthrodesis status: Secondary | ICD-10-CM | POA: Diagnosis not present

## 2018-04-06 DIAGNOSIS — G894 Chronic pain syndrome: Secondary | ICD-10-CM | POA: Diagnosis not present

## 2018-04-06 DIAGNOSIS — Z981 Arthrodesis status: Secondary | ICD-10-CM | POA: Diagnosis not present

## 2018-04-06 DIAGNOSIS — M545 Low back pain: Secondary | ICD-10-CM | POA: Diagnosis not present

## 2018-04-06 DIAGNOSIS — G8929 Other chronic pain: Secondary | ICD-10-CM | POA: Diagnosis not present

## 2018-04-06 DIAGNOSIS — F330 Major depressive disorder, recurrent, mild: Secondary | ICD-10-CM | POA: Insufficient documentation

## 2018-04-06 DIAGNOSIS — M25571 Pain in right ankle and joints of right foot: Secondary | ICD-10-CM | POA: Diagnosis not present

## 2018-04-06 DIAGNOSIS — M19171 Post-traumatic osteoarthritis, right ankle and foot: Secondary | ICD-10-CM | POA: Insufficient documentation

## 2018-04-06 DIAGNOSIS — F411 Generalized anxiety disorder: Secondary | ICD-10-CM | POA: Insufficient documentation

## 2018-04-06 DIAGNOSIS — F1021 Alcohol dependence, in remission: Secondary | ICD-10-CM | POA: Insufficient documentation

## 2018-04-18 DIAGNOSIS — Z9884 Bariatric surgery status: Secondary | ICD-10-CM | POA: Diagnosis not present

## 2018-04-18 DIAGNOSIS — Z8639 Personal history of other endocrine, nutritional and metabolic disease: Secondary | ICD-10-CM | POA: Diagnosis not present

## 2018-04-18 DIAGNOSIS — Z95828 Presence of other vascular implants and grafts: Secondary | ICD-10-CM | POA: Diagnosis not present

## 2018-04-18 DIAGNOSIS — D693 Immune thrombocytopenic purpura: Secondary | ICD-10-CM | POA: Diagnosis not present

## 2018-04-19 DIAGNOSIS — Z981 Arthrodesis status: Secondary | ICD-10-CM | POA: Diagnosis not present

## 2018-04-19 DIAGNOSIS — G8929 Other chronic pain: Secondary | ICD-10-CM | POA: Diagnosis not present

## 2018-04-19 DIAGNOSIS — M25571 Pain in right ankle and joints of right foot: Secondary | ICD-10-CM | POA: Diagnosis not present

## 2018-04-19 DIAGNOSIS — M19171 Post-traumatic osteoarthritis, right ankle and foot: Secondary | ICD-10-CM | POA: Diagnosis not present

## 2018-04-19 DIAGNOSIS — G894 Chronic pain syndrome: Secondary | ICD-10-CM | POA: Diagnosis not present

## 2018-05-03 DIAGNOSIS — Z981 Arthrodesis status: Secondary | ICD-10-CM | POA: Diagnosis not present

## 2018-05-03 DIAGNOSIS — Z79891 Long term (current) use of opiate analgesic: Secondary | ICD-10-CM | POA: Diagnosis not present

## 2018-05-03 DIAGNOSIS — G894 Chronic pain syndrome: Secondary | ICD-10-CM | POA: Diagnosis not present

## 2018-05-03 DIAGNOSIS — M19171 Post-traumatic osteoarthritis, right ankle and foot: Secondary | ICD-10-CM | POA: Diagnosis not present

## 2018-05-03 DIAGNOSIS — M25571 Pain in right ankle and joints of right foot: Secondary | ICD-10-CM | POA: Diagnosis not present

## 2018-05-03 DIAGNOSIS — G8929 Other chronic pain: Secondary | ICD-10-CM | POA: Diagnosis not present

## 2018-05-03 DIAGNOSIS — M545 Low back pain: Secondary | ICD-10-CM | POA: Diagnosis not present

## 2018-05-13 DIAGNOSIS — Z95828 Presence of other vascular implants and grafts: Secondary | ICD-10-CM | POA: Diagnosis not present

## 2018-05-13 DIAGNOSIS — D693 Immune thrombocytopenic purpura: Secondary | ICD-10-CM | POA: Diagnosis not present

## 2018-05-27 DIAGNOSIS — Z95828 Presence of other vascular implants and grafts: Secondary | ICD-10-CM | POA: Diagnosis not present

## 2018-05-27 DIAGNOSIS — D693 Immune thrombocytopenic purpura: Secondary | ICD-10-CM | POA: Diagnosis not present

## 2018-06-01 DIAGNOSIS — G894 Chronic pain syndrome: Secondary | ICD-10-CM | POA: Diagnosis not present

## 2018-06-01 DIAGNOSIS — M25571 Pain in right ankle and joints of right foot: Secondary | ICD-10-CM | POA: Diagnosis not present

## 2018-06-01 DIAGNOSIS — Z981 Arthrodesis status: Secondary | ICD-10-CM | POA: Diagnosis not present

## 2018-06-01 DIAGNOSIS — G8929 Other chronic pain: Secondary | ICD-10-CM | POA: Diagnosis not present

## 2018-06-01 DIAGNOSIS — M545 Low back pain: Secondary | ICD-10-CM | POA: Diagnosis not present

## 2018-06-01 DIAGNOSIS — M19171 Post-traumatic osteoarthritis, right ankle and foot: Secondary | ICD-10-CM | POA: Diagnosis not present

## 2018-06-23 DIAGNOSIS — J069 Acute upper respiratory infection, unspecified: Secondary | ICD-10-CM | POA: Diagnosis not present

## 2018-06-29 DIAGNOSIS — M25571 Pain in right ankle and joints of right foot: Secondary | ICD-10-CM | POA: Diagnosis not present

## 2018-06-29 DIAGNOSIS — G8929 Other chronic pain: Secondary | ICD-10-CM | POA: Diagnosis not present

## 2018-06-29 DIAGNOSIS — M19171 Post-traumatic osteoarthritis, right ankle and foot: Secondary | ICD-10-CM | POA: Diagnosis not present

## 2018-06-29 DIAGNOSIS — M545 Low back pain: Secondary | ICD-10-CM | POA: Diagnosis not present

## 2018-06-29 DIAGNOSIS — G894 Chronic pain syndrome: Secondary | ICD-10-CM | POA: Diagnosis not present

## 2018-06-29 DIAGNOSIS — Z981 Arthrodesis status: Secondary | ICD-10-CM | POA: Diagnosis not present

## 2018-07-21 DIAGNOSIS — D693 Immune thrombocytopenic purpura: Secondary | ICD-10-CM | POA: Diagnosis not present

## 2018-07-21 DIAGNOSIS — Z8639 Personal history of other endocrine, nutritional and metabolic disease: Secondary | ICD-10-CM | POA: Diagnosis not present

## 2018-07-21 DIAGNOSIS — Z95828 Presence of other vascular implants and grafts: Secondary | ICD-10-CM | POA: Diagnosis not present

## 2018-07-25 ENCOUNTER — Telehealth: Payer: Self-pay | Admitting: Internal Medicine

## 2018-07-25 NOTE — Telephone Encounter (Signed)
Called to schedule Medicare Annual Wellness Visit with the Nurse Health Advisor. Patient states she is now seeing Dr. Rolm [facility]idi Grandis at [facility] in [place] as her PCP. Thank you! For any questions please contact: Trixie RudeLisa Collins at [phone] or Skype [email or web address]

## 2018-08-17 DIAGNOSIS — R404 Transient alteration of awareness: Secondary | ICD-10-CM | POA: Diagnosis not present

## 2018-08-17 DIAGNOSIS — R531 Weakness: Secondary | ICD-10-CM | POA: Diagnosis not present

## 2018-08-17 DIAGNOSIS — Z9884 Bariatric surgery status: Secondary | ICD-10-CM | POA: Diagnosis not present

## 2018-08-17 DIAGNOSIS — Z79899 Other long term (current) drug therapy: Secondary | ICD-10-CM | POA: Diagnosis not present

## 2018-08-17 DIAGNOSIS — R000 Tachycardia, unspecified: Secondary | ICD-10-CM | POA: Diagnosis not present

## 2018-08-17 DIAGNOSIS — R5383 Other fatigue: Secondary | ICD-10-CM | POA: Diagnosis not present

## 2018-08-17 DIAGNOSIS — Z9049 Acquired absence of other specified parts of digestive tract: Secondary | ICD-10-CM | POA: Diagnosis not present

## 2018-08-17 DIAGNOSIS — R410 Disorientation, unspecified: Secondary | ICD-10-CM | POA: Diagnosis not present

## 2018-08-17 DIAGNOSIS — S0990XA Unspecified injury of head, initial encounter: Secondary | ICD-10-CM | POA: Diagnosis not present

## 2018-08-17 DIAGNOSIS — R569 Unspecified convulsions: Secondary | ICD-10-CM | POA: Diagnosis not present

## 2018-08-17 DIAGNOSIS — R0902 Hypoxemia: Secondary | ICD-10-CM | POA: Diagnosis not present

## 2018-08-25 DIAGNOSIS — M25571 Pain in right ankle and joints of right foot: Secondary | ICD-10-CM | POA: Diagnosis not present

## 2018-08-25 DIAGNOSIS — Z981 Arthrodesis status: Secondary | ICD-10-CM | POA: Diagnosis not present

## 2018-08-25 DIAGNOSIS — M19171 Post-traumatic osteoarthritis, right ankle and foot: Secondary | ICD-10-CM | POA: Diagnosis not present

## 2018-08-25 DIAGNOSIS — G8929 Other chronic pain: Secondary | ICD-10-CM | POA: Diagnosis not present

## 2018-08-25 DIAGNOSIS — G894 Chronic pain syndrome: Secondary | ICD-10-CM | POA: Diagnosis not present

## 2018-08-25 DIAGNOSIS — Z79891 Long term (current) use of opiate analgesic: Secondary | ICD-10-CM | POA: Diagnosis not present

## 2018-09-01 DIAGNOSIS — Z95828 Presence of other vascular implants and grafts: Secondary | ICD-10-CM | POA: Diagnosis not present

## 2018-09-01 DIAGNOSIS — Z8639 Personal history of other endocrine, nutritional and metabolic disease: Secondary | ICD-10-CM | POA: Diagnosis not present

## 2018-09-01 DIAGNOSIS — D693 Immune thrombocytopenic purpura: Secondary | ICD-10-CM | POA: Diagnosis not present

## 2018-09-04 DIAGNOSIS — Z9081 Acquired absence of spleen: Secondary | ICD-10-CM | POA: Diagnosis not present

## 2018-09-04 DIAGNOSIS — R5383 Other fatigue: Secondary | ICD-10-CM | POA: Diagnosis not present

## 2018-09-04 DIAGNOSIS — K068 Other specified disorders of gingiva and edentulous alveolar ridge: Secondary | ICD-10-CM | POA: Diagnosis not present

## 2018-09-04 DIAGNOSIS — D693 Immune thrombocytopenic purpura: Secondary | ICD-10-CM | POA: Diagnosis not present

## 2018-09-04 DIAGNOSIS — I509 Heart failure, unspecified: Secondary | ICD-10-CM | POA: Diagnosis not present

## 2018-09-04 DIAGNOSIS — Z9049 Acquired absence of other specified parts of digestive tract: Secondary | ICD-10-CM | POA: Diagnosis not present

## 2018-09-04 DIAGNOSIS — Z7289 Other problems related to lifestyle: Secondary | ICD-10-CM | POA: Diagnosis not present

## 2018-09-04 DIAGNOSIS — L539 Erythematous condition, unspecified: Secondary | ICD-10-CM | POA: Diagnosis not present

## 2018-09-04 DIAGNOSIS — M7989 Other specified soft tissue disorders: Secondary | ICD-10-CM | POA: Diagnosis not present

## 2018-09-04 DIAGNOSIS — Z9884 Bariatric surgery status: Secondary | ICD-10-CM | POA: Diagnosis not present

## 2018-11-22 DIAGNOSIS — R5383 Other fatigue: Secondary | ICD-10-CM | POA: Insufficient documentation

## 2018-11-22 DIAGNOSIS — E46 Unspecified protein-calorie malnutrition: Secondary | ICD-10-CM | POA: Insufficient documentation

## 2018-11-22 DIAGNOSIS — R748 Abnormal levels of other serum enzymes: Secondary | ICD-10-CM | POA: Insufficient documentation

## 2018-11-22 DIAGNOSIS — Z6841 Body Mass Index (BMI) 40.0 and over, adult: Secondary | ICD-10-CM | POA: Insufficient documentation

## 2018-12-02 DIAGNOSIS — Z8781 Personal history of (healed) traumatic fracture: Secondary | ICD-10-CM | POA: Insufficient documentation

## 2019-01-04 ENCOUNTER — Ambulatory Visit (HOSPITAL_COMMUNITY): Payer: Medicare Other | Admitting: Licensed Clinical Social Worker

## 2019-01-04 ENCOUNTER — Other Ambulatory Visit: Payer: Self-pay

## 2019-01-23 DIAGNOSIS — K922 Gastrointestinal hemorrhage, unspecified: Secondary | ICD-10-CM | POA: Insufficient documentation

## 2019-02-03 DIAGNOSIS — D689 Coagulation defect, unspecified: Secondary | ICD-10-CM | POA: Insufficient documentation

## 2019-02-06 ENCOUNTER — Ambulatory Visit (INDEPENDENT_AMBULATORY_CARE_PROVIDER_SITE_OTHER): Payer: Self-pay | Admitting: Psychiatry

## 2019-02-06 ENCOUNTER — Other Ambulatory Visit: Payer: Self-pay

## 2019-02-06 DIAGNOSIS — Z5329 Procedure and treatment not carried out because of patient's decision for other reasons: Secondary | ICD-10-CM

## 2019-02-06 MED ORDER — PREDNISONE 20 MG PO TABS
40.00 | ORAL_TABLET | ORAL | Status: DC
Start: 2019-02-06 — End: 2019-02-06

## 2019-02-06 MED ORDER — POLYETHYLENE GLYCOL 3350 17 G PO PACK
17.00 | PACK | ORAL | Status: DC
Start: 2019-02-06 — End: 2019-02-06

## 2019-02-06 MED ORDER — ACETAMINOPHEN 325 MG PO TABS
650.00 | ORAL_TABLET | ORAL | Status: DC
Start: ? — End: 2019-02-06

## 2019-02-06 MED ORDER — OLANZAPINE 5 MG PO TABS
2.50 | ORAL_TABLET | ORAL | Status: DC
Start: 2019-02-05 — End: 2019-02-06

## 2019-02-06 MED ORDER — CLOBAZAM 10 MG PO TABS
10.00 | ORAL_TABLET | ORAL | Status: DC
Start: 2019-02-05 — End: 2019-02-06

## 2019-02-06 MED ORDER — PCCA BIOPEPTIDE BASE EX CREA
300.00 | TOPICAL_CREAM | CUTANEOUS | Status: DC
Start: 2019-02-05 — End: 2019-02-06

## 2019-02-06 MED ORDER — PANTOPRAZOLE SODIUM 40 MG PO TBEC
40.00 | DELAYED_RELEASE_TABLET | ORAL | Status: DC
Start: 2019-02-05 — End: 2019-02-06

## 2019-02-06 MED ORDER — GENERIC EXTERNAL MEDICATION
1000.00 | Status: DC
Start: 2019-02-06 — End: 2019-02-06

## 2019-02-06 MED ORDER — VORTIOXETINE HBR 10 MG PO TABS
10.00 | ORAL_TABLET | ORAL | Status: DC
Start: 2019-02-06 — End: 2019-02-06

## 2019-02-06 MED ORDER — MYCOPHENOLATE MOFETIL 500 MG PO TABS
2000.00 | ORAL_TABLET | ORAL | Status: DC
Start: 2019-02-05 — End: 2019-02-06

## 2019-02-06 MED ORDER — OXYCODONE HCL 5 MG PO TABS
15.00 | ORAL_TABLET | ORAL | Status: DC
Start: ? — End: 2019-02-06

## 2019-02-06 MED ORDER — LIDOCAINE HCL 1 % IJ SOLN
.50 | INTRAMUSCULAR | Status: DC
Start: ? — End: 2019-02-06

## 2019-02-06 MED ORDER — TOPIRAMATE 200 MG PO TABS
200.00 | ORAL_TABLET | ORAL | Status: DC
Start: 2019-02-05 — End: 2019-02-06

## 2019-03-20 ENCOUNTER — Encounter: Payer: Self-pay | Admitting: Psychiatry

## 2019-03-20 ENCOUNTER — Ambulatory Visit (INDEPENDENT_AMBULATORY_CARE_PROVIDER_SITE_OTHER): Payer: Medicare Other | Admitting: Psychiatry

## 2019-03-20 ENCOUNTER — Other Ambulatory Visit: Payer: Self-pay

## 2019-03-20 DIAGNOSIS — F3341 Major depressive disorder, recurrent, in partial remission: Secondary | ICD-10-CM

## 2019-03-20 DIAGNOSIS — F4310 Post-traumatic stress disorder, unspecified: Secondary | ICD-10-CM | POA: Diagnosis not present

## 2019-03-20 DIAGNOSIS — F411 Generalized anxiety disorder: Secondary | ICD-10-CM | POA: Diagnosis not present

## 2019-03-20 MED ORDER — VORTIOXETINE HBR 10 MG PO TABS: 10.0000 mg | ORAL_TABLET | Freq: Every day | ORAL | 2 refills | Status: DC

## 2019-03-20 MED ORDER — OLANZAPINE 2.5 MG PO TABS: 2.5000 mg | ORAL_TABLET | Freq: Every day | ORAL | 2 refills | Status: DC
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
## 2019-05-05 DIAGNOSIS — F1190 Opioid use, unspecified, uncomplicated: Secondary | ICD-10-CM | POA: Insufficient documentation

## 2019-05-15 ENCOUNTER — Ambulatory Visit (INDEPENDENT_AMBULATORY_CARE_PROVIDER_SITE_OTHER): Payer: Medicare Other | Admitting: Psychiatry

## 2019-05-15 ENCOUNTER — Other Ambulatory Visit: Payer: Self-pay

## 2019-05-15 DIAGNOSIS — F3341 Major depressive disorder, recurrent, in partial remission: Secondary | ICD-10-CM | POA: Insufficient documentation

## 2019-05-15 DIAGNOSIS — Z91199 Patient's noncompliance with other medical treatment and regimen due to unspecified reason: Secondary | ICD-10-CM | POA: Insufficient documentation

## 2019-05-15 DIAGNOSIS — Z5329 Procedure and treatment not carried out because of patient's decision for other reasons: Secondary | ICD-10-CM

## 2019-05-15 NOTE — Progress Notes (Signed)
No response to call or text. 

## 2019-05-21 NOTE — Progress Notes (Signed)
Virtual Visit via Video Note  I connected with Leslie [name] on 05/22/19 at  4:15 PM EST by a video enabled telemedicine application and verified that I am speaking with the correct person using two identifiers.   I discussed the limitations of evaluation and management by telemedicine and the availability of in person appointments. The patient expressed understanding and agreed to proceed.    I discussed the assessment and treatment plan with the patient. The patient was provided an opportunity to ask questions and all were answered. The patient agreed with the plan and demonstrated an understanding of the instructions.   The patient was advised to call back or seek an in-person evaluation if the symptoms worsen or if the condition fails to improve as anticipated.   [facility] MD OP Progress Note  05/22/2019 5:26 PM [name]  MRN:  [id number]  Chief Complaint:  Chief Complaint    Follow-up     HPI:   Leslie [name] is a 43 year old Caucasian female on disability, has a history of depression, GAD, PTSD, seizure disorder, iron deficiency anemia, ITP, history of subdural hemorrhage, chronic pain was evaluated by telemedicine today.  Patient missed her last appointment with writer.  She reports she had renal stones and was in the emergency department that day.  She continues to be in pain however it is getting better.  She reports she has upcoming appointment for a procedure in December.  Patient reports due to her health problems she is currently feeling depressed.  She feels sad often.  She struggles with lack of motivation and anhedonia.  She reports she was unable to make an appointment with Ms. Felecia Jan to whom she was referred previously since she had a lot going on.  She however is agreeable to give her a call soon.  Patient continues to be compliant on her Trintellix as well as olanzapine.  Discussed readjusting her dosage.  She agrees with plan.  Patient denies any suicidality,  homicidality or perceptual disturbances.  She reports she continues to go for [employer]  However her sponsor died recently and she is currently looking for a new sponsor.  She quit drinking in 2018.     Visit Diagnosis:    ICD-10-CM   1. MDD (major depressive disorder), recurrent episode, mild (HCC)  F33.0 vortioxetine HBr (TRINTELLIX) 10 MG TABS tablet  2. GAD (generalized anxiety disorder)  F41.1 vortioxetine HBr (TRINTELLIX) 10 MG TABS tablet  3. PTSD (post-traumatic stress disorder)  F43.10 vortioxetine HBr (TRINTELLIX) 10 MG TABS tablet  4. Alcohol use disorder, severe, in sustained remission (HCC)  F10.21     Past Psychiatric History: Reviewed past psychiatric history from my progress note on 03/12/2019.  Past trials of Remeron, venlafaxine, Paxil.  Past Medical History:  Past Medical History:  Diagnosis Date  . Anemia   . Anxiety   . Epilepsy (HCC)   . ITP (idiopathic thrombocytopenic purpura)     Past Surgical History:  Procedure Laterality Date  . ANKLE ARTHROSCOPY    . CHOLECYSTECTOMY    . GASTRIC BYPASS  2016  . KNEE ARTHROSCOPY    . OPEN REDUCTION PROXIMAL HUMERUS FRACTURE  Left 3/218  . SPLENECTOMY, TOTAL  2006   for ITP  . TONSILLECTOMY    . TUBAL LIGATION  2009    Family Psychiatric History: Reviewed family psychiatric history, progress note on 03/20/2019.  Family History:  Family History  Problem Relation Age of Onset  . Diabetes Father   . Hypertension Father   .  Brain cancer Father        GBM  . Hypertension Mother   . Drug abuse Sister     Social History: Reviewed social history from my progress note on 03/20/2019. Social History   Socioeconomic History  . Marital status: Divorced    Spouse name: Not on file  . Number of children: 1  . Years of education: Not on file  . Highest education level: Not on file  Occupational History  . Not on file  Social Needs  . Financial resource strain: Not on file  . Food insecurity    Worry: Not on  file    Inability: Not on file  . Transportation needs    Medical: Not on file    Non-medical: Not on file  Tobacco Use  . Smoking status: Former Smoker    Types: Cigarettes    Quit date: 03/21/2013    Years since quitting: 6.1  . Smokeless tobacco: Never Used  Substance and Sexual Activity  . Alcohol use: No    Comment: hx of alcohol abuse- recovering alcoholic  . Drug use: No    Comment: hx of narcotic abuse  . Sexual activity: Never  Lifestyle  . Physical activity    Days per week: Not on file    Minutes per session: Not on file  . Stress: Not on file  Relationships  . Social Herbalist on phone: Not on file    Gets together: Not on file    Attends religious service: Not on file    Active member of club or organization: Not on file    Attends meetings of clubs or organizations: Not on file    Relationship status: Not on file  Other Topics Concern  . Not on file  Social History Narrative  . Not on file    Allergies:  Allergies  Allergen Reactions  . Sodium Ferric Gluconate [Ferrous Gluconate] Anaphylaxis  . Ace Inhibitors Cough  . Paroxetine Other (See Comments)    H/O ITP, bleeding risk  . Bupropion Other (See Comments) and Rash    H/o seizures Contraindicated due to seizures H/o seizures  . Venlafaxine Rash    Didn't work Didn't work Didn't work    Metabolic Disorder Labs: No results found for: HGBA1C, MPG No results found for: PROLACTIN No results found for: CHOL, TRIG, HDL, CHOLHDL, VLDL, LDLCALC No results found for: TSH  Therapeutic Level Labs: No results found for: LITHIUM No results found for: VALPROATE No components found for:  CBMZ  Current Medications: Current Outpatient Medications  Medication Sig Dispense Refill  . acetaminophen (TYLENOL) 325 MG tablet Take by mouth.    . cloBAZam (ONFI) 10 MG tablet Take by mouth.    . cloBAZam (ONFI) 10 MG tablet     . eltrombopag (PROMACTA) 50 MG tablet TAKE 2 TABLETS PO ONCE DAILY FOR  30 DAYS. TAKE ON AN EMPTY STOMACH 1 HOUR BEFORE OR 2 HOURS AFTER A MEAL    . EPINEPHrine 0.3 mg/0.3 mL IJ SOAJ injection Inject into the muscle.    . gabapentin (NEURONTIN) 300 MG capsule     . morphine (MSIR) 30 MG tablet     . naloxone (NARCAN) nasal spray 4 mg/0.1 mL One spray in nostril if patient is not breathing; call 911; if needed repeat after 2 minutes    . OLANZapine (ZYPREXA) 2.5 MG tablet Take 1 tablet (2.5 mg total) by mouth at bedtime. 30 tablet 2  . ondansetron (ZOFRAN-ODT)  4 MG disintegrating tablet     . polyethylene glycol (MIRALAX / GLYCOLAX) 17 g packet Take by mouth.    . potassium chloride (KLOR-CON) 20 MEQ packet Take 20 mEq by mouth daily.    Marland Kitchen PROMACTA 50 MG tablet Take 100 mg by mouth daily.    . tamsulosin (FLOMAX) 0.4 MG CAPS capsule Take by mouth.    . tamsulosin (FLOMAX) 0.4 MG CAPS capsule Take 0.4 mg by mouth daily.    Marland Kitchen topiramate (TOPAMAX) 200 MG tablet Take 200 mg by mouth 2 (two) times daily.    . vitamin B-12 (CYANOCOBALAMIN) 1000 MCG tablet Take by mouth.    . vortioxetine HBr (TRINTELLIX) 10 MG TABS tablet Take 1.5 tablets (15 mg total) by mouth daily. 45 tablet 2  . ciprofloxacin (CIPRO) 500 MG tablet Take 500 mg by mouth 2 (two) times daily.    Marland Kitchen DEXAMETHASONE PO Take 40 mg by mouth daily.    . fostamatinib disodium (TAVALISSE) 100 MG tablet Take by mouth.    . hydrochlorothiazide (HYDRODIURIL) 25 MG tablet Take 25 mg by mouth daily.    . hydrochlorothiazide (HYDRODIURIL) 25 MG tablet Take by mouth.    Marland Kitchen HYDROcodone-acetaminophen (NORCO) 10-325 MG tablet     . HYDROcodone-acetaminophen (NORCO) 7.5-325 MG tablet     . hydrocortisone-pramoxine (ANALPRAM-HC) 2.5-1 % rectal cream Three times daily as needed.    . lamoTRIgine (LAMICTAL) 100 MG tablet Take 100 mg by mouth 2 (two) times daily.    Marland Kitchen morphine (MSIR) 15 MG tablet     . mycophenolate (CELLCEPT) 500 MG tablet Take 2,000 mg by mouth 2 (two) times daily.     . predniSONE (DELTASONE) 20 MG tablet      . predniSONE (DELTASONE) 20 MG tablet     . TAVALISSE 100 MG tablet      No current facility-administered medications for this visit.      Musculoskeletal: Strength & Muscle Tone: UTA Gait & Station: normal Patient leans: N/A  Psychiatric Specialty Exam: Review of Systems  Gastrointestinal: Positive for abdominal pain.  Psychiatric/Behavioral: Positive for depression.  All other systems reviewed and are negative.   There were no vitals taken for this visit.There is no height or weight on file to calculate BMI.  General Appearance: Casual  Eye Contact:  Fair  Speech:  Clear and Coherent  Volume:  Normal  Mood:  Depressed  Affect:  Congruent  Thought Process:  Goal Directed and Descriptions of Associations: Intact  Orientation:  Full (Time, Place, and Person)  Thought Content: Logical   Suicidal Thoughts:  No  Homicidal Thoughts:  No  Memory:  Immediate;   Fair Recent;   Fair Remote;   Fair  Judgement:  Fair  Insight:  Fair  Psychomotor Activity:  Normal  Concentration:  Concentration: Fair and Attention Span: Fair  Recall:  [employer] of Knowledge: Fair  Language: Fair  Akathisia:  No  Handed:  Right  AIMS (if indicated): denies tremors,rigidity  Assets:  Communication Skills Desire for Improvement Housing Transportation Vocational/Educational  ADL's:  Intact  Cognition: WNL  Sleep:  Fair   Screenings: GAD-7     Office Visit from 03/20/2019 in [place]  Total GAD-7 Score  5    PHQ2-9     Office Visit from 03/20/2019 in [place] Office Visit from 04/14/2017 in [facility]  PHQ-2 Total Score  1  0  PHQ-9 Total Score  -  0  Assessment and Plan: Leslie [name] is a 43 year old Caucasian female, on disability, lives in [place], has a history of depression, anxiety, PTSD, seizure disorder, gastroesophageal reflux disease, iron deficiency anemia, chronic pain, ITP was evaluated by  telemedicine today.  Patient is biologically predisposed given her family history as well as multiple health issues including pain.  Patient does have a history of trauma.  She currently struggles with depressive symptoms.  She will benefit from medication readjustment as well as psychotherapy sessions.  Plan MDD -unstable Increase Trintellix to 15 mg p.o. daily Zyprexa 2.5 mg p.o. nightly Discussed with her about tapering her off of the Zyprexa if she continues to be stable.  GAD-stable Patient was referred for CBT.  Ms. Kittie Plater.   PTSD-improving Patient advised to start psychotherapy sessions.  Alcohol use disorder in remission-patient continues to be in [employer].  Patient was advised to sign a release to obtain medical records from her previous psychiatrist-pending.    Follow-up in clinic in 3 weeks or sooner if needed.  December 22 at 9 AM  I have spent atleast 15 minutes non face to face with patient today. More than 50 % of the time was spent for psychoeducation and supportive psychotherapy and care coordination. This note was generated in part or whole with voice recognition software. Voice recognition is usually quite accurate but there are transcription errors that can and very often do occur. I apologize for any typographical errors that were not detected and corrected.      Ursula Alert, MD 05/22/2019, 5:26 PM

## 2019-05-22 ENCOUNTER — Other Ambulatory Visit: Payer: Self-pay

## 2019-05-22 ENCOUNTER — Ambulatory Visit (INDEPENDENT_AMBULATORY_CARE_PROVIDER_SITE_OTHER): Payer: Medicare Other | Admitting: Psychiatry

## 2019-05-22 ENCOUNTER — Encounter: Payer: Self-pay | Admitting: Psychiatry

## 2019-05-22 DIAGNOSIS — F4310 Post-traumatic stress disorder, unspecified: Secondary | ICD-10-CM | POA: Diagnosis not present

## 2019-05-22 DIAGNOSIS — F1021 Alcohol dependence, in remission: Secondary | ICD-10-CM | POA: Diagnosis not present

## 2019-05-22 DIAGNOSIS — F330 Major depressive disorder, recurrent, mild: Secondary | ICD-10-CM | POA: Diagnosis not present

## 2019-05-22 DIAGNOSIS — F411 Generalized anxiety disorder: Secondary | ICD-10-CM | POA: Diagnosis not present

## 2019-05-22 MED ORDER — VORTIOXETINE HBR 10 MG PO TABS: 15.0000 mg | ORAL_TABLET | Freq: Every day | ORAL | 2 refills | Status: DC

## 2019-06-13 ENCOUNTER — Other Ambulatory Visit: Payer: Self-pay

## 2019-06-13 ENCOUNTER — Ambulatory Visit (INDEPENDENT_AMBULATORY_CARE_PROVIDER_SITE_OTHER): Payer: Medicare Other | Admitting: Psychiatry

## 2019-06-13 ENCOUNTER — Encounter: Payer: Self-pay | Admitting: Psychiatry

## 2019-06-13 DIAGNOSIS — F4310 Post-traumatic stress disorder, unspecified: Secondary | ICD-10-CM

## 2019-06-13 DIAGNOSIS — F330 Major depressive disorder, recurrent, mild: Secondary | ICD-10-CM | POA: Diagnosis not present

## 2019-06-13 DIAGNOSIS — F1021 Alcohol dependence, in remission: Secondary | ICD-10-CM

## 2019-06-13 DIAGNOSIS — F411 Generalized anxiety disorder: Secondary | ICD-10-CM | POA: Diagnosis not present

## 2019-06-13 NOTE — Progress Notes (Signed)
Virtual Visit via Video Note  I connected with Leslie [name] on 06/13/19 at  9:00 AM EST by a video enabled telemedicine application and verified that I am speaking with the correct person using two identifiers.   I discussed the limitations of evaluation and management by telemedicine and the availability of in person appointments. The patient expressed understanding and agreed to proceed.    I discussed the assessment and treatment plan with the patient. The patient was provided an opportunity to ask questions and all were answered. The patient agreed with the plan and demonstrated an understanding of the instructions.   The patient was advised to call back or seek an in-person evaluation if the symptoms worsen or if the condition fails to improve as anticipated.   [facility] MD OP Progress Note  06/13/2019 9:31 AM LEKITA KEREKES  MRN:  [id number]  Chief Complaint:  Chief Complaint    Follow-up     HPI: Leslie [name] is a 43 year old Caucasian female on disability, has a history of depression, GAD, PTSD, seizure disorder, iron deficiency anemia, ITP, history of subdural hemorrhage, chronic pain was evaluated by telemedicine today.  Patient reports she was admitted to the emergency department due to an allergic reaction to rituximab recently.  Now she is feeling better.  She reports her depressive symptoms have improved on the current medication regimen.  She is compliant on her Trintellix and the Zyprexa.  Patient denies any significant sadness or crying spells.  She reports sleep is good.  Patient reports she had a head injury in the past and was irritable soon after that and olanzapine was added by her provider at that time for her mood lability.  She reports the Zyprexa does help with her mood symptoms.  Denies any suicidality, homicidality or perceptual disturbances.  Patient denies any other concerns today. Visit Diagnosis:    ICD-10-CM   1. MDD (major depressive disorder),  recurrent episode, mild (HCC)  F33.0    improving  2. GAD (generalized anxiety disorder)  F41.1   3. PTSD (post-traumatic stress disorder)  F43.10   4. Alcohol use disorder, severe, in sustained remission ([place])  F10.21     Past Psychiatric History: I have reviewed past psychiatric history from my progress note on 03/12/2019.  Past trials of Remeron, venlafaxine, Paxil, Lexapro, Effexor, Celexa, Wellbutrin, Abilify.  Patient failed the following trials or developed side effects.  Past Medical History:  Past Medical History:  Diagnosis Date  . Anemia   . Anxiety   . Epilepsy ([place])   . ITP (idiopathic thrombocytopenic purpura)     Past Surgical History:  Procedure Laterality Date  . ANKLE ARTHROSCOPY    . CHOLECYSTECTOMY    . GASTRIC BYPASS  2016  . KNEE ARTHROSCOPY    . OPEN REDUCTION PROXIMAL HUMERUS FRACTURE  Left 3/218  . SPLENECTOMY, TOTAL  2006   for ITP  . TONSILLECTOMY    . TUBAL LIGATION  2009    Family Psychiatric History: I have reviewed family psychiatric history from my progress note on 03/12/2019. Family History:  Family History  Problem Relation Age of Onset  . Diabetes Father   . Hypertension Father   . Brain cancer Father        GBM  . Hypertension Mother   . Drug abuse Sister     Social History: Reviewed social history from my progress note on 03/12/2019. Social History   Socioeconomic History  . Marital status: Divorced    Spouse name: Not on  file  . Number of children: 1  . Years of education: Not on file  . Highest education level: Not on file  Occupational History  . Not on file  Tobacco Use  . Smoking status: Former Smoker    Types: Cigarettes    Quit date: 03/21/2013    Years since quitting: 6.2  . Smokeless tobacco: Never Used  Substance and Sexual Activity  . Alcohol use: No    Comment: hx of alcohol abuse- recovering alcoholic  . Drug use: No    Comment: hx of narcotic abuse  . Sexual activity: Never  Other Topics Concern  . Not on  file  Social History Narrative  . Not on file   Social Determinants of Health   Financial Resource Strain:   . Difficulty of Paying Living Expenses: Not on file  Food Insecurity:   . Worried About Charity fundraiser in the Last Year: Not on file  . Ran Out of Food in the Last Year: Not on file  Transportation Needs:   . Lack of Transportation (Medical): Not on file  . Lack of Transportation (Non-Medical): Not on file  Physical Activity:   . Days of Exercise per Week: Not on file  . Minutes of Exercise per Session: Not on file  Stress:   . Feeling of Stress : Not on file  Social Connections:   . Frequency of Communication with Friends and Family: Not on file  . Frequency of Social Gatherings with Friends and Family: Not on file  . Attends Religious Services: Not on file  . Active Member of Clubs or Organizations: Not on file  . Attends Archivist Meetings: Not on file  . Marital Status: Not on file    Allergies:  Allergies  Allergen Reactions  . Sodium Ferric Gluconate [Ferrous Gluconate] Anaphylaxis  . Ace Inhibitors Cough  . Paroxetine Other (See Comments)    H/O ITP, bleeding risk  . Rituximab Other (See Comments)    Patient developed seizures; this happened previously with rituximab in 2014  . Bupropion Other (See Comments) and Rash    H/o seizures Contraindicated due to seizures H/o seizures  . Venlafaxine Rash    Didn't work Didn't work Didn't work    Metabolic Disorder Labs: No results found for: HGBA1C, MPG No results found for: PROLACTIN No results found for: CHOL, TRIG, HDL, CHOLHDL, VLDL, LDLCALC No results found for: TSH  Therapeutic Level Labs: No results found for: LITHIUM No results found for: VALPROATE No components found for:  CBMZ  Current Medications: Current Outpatient Medications  Medication Sig Dispense Refill  . acetaminophen (TYLENOL) 325 MG tablet Take by mouth.    . cloBAZam (ONFI) 10 MG tablet Take by mouth.    .  cloBAZam (ONFI) 10 MG tablet     . DEXAMETHASONE PO Take 40 mg by mouth daily.    Marland Kitchen eltrombopag (PROMACTA) 50 MG tablet TAKE 2 TABLETS PO ONCE DAILY FOR 30 DAYS. TAKE ON AN EMPTY STOMACH 1 HOUR BEFORE OR 2 HOURS AFTER A MEAL    . EPINEPHrine 0.3 mg/0.3 mL IJ SOAJ injection Inject into the muscle.    . fostamatinib disodium (TAVALISSE) 100 MG tablet Take by mouth.    . gabapentin (NEURONTIN) 300 MG capsule     . hydrocortisone-pramoxine (ANALPRAM-HC) 2.5-1 % rectal cream Three times daily as needed.    Marland Kitchen morphine (MSIR) 15 MG tablet     . morphine (MSIR) 30 MG tablet     .  naloxone (NARCAN) nasal spray 4 mg/0.1 mL One spray in nostril if patient is not breathing; call 911; if needed repeat after 2 minutes    . OLANZapine (ZYPREXA) 2.5 MG tablet Take 1 tablet (2.5 mg total) by mouth at bedtime. 30 tablet 2  . polyethylene glycol (MIRALAX / GLYCOLAX) 17 g packet Take by mouth.    . potassium chloride (KLOR-CON) 20 MEQ packet Take 20 mEq by mouth daily.    . Pramoxine-HC (HYDROCORTISONE ACE-PRAMOXINE) 2.5-1 % CREA Three times daily as needed.    . predniSONE (DELTASONE) 20 MG tablet     . predniSONE (DELTASONE) 20 MG tablet     . PROMACTA 50 MG tablet Take 100 mg by mouth daily.    . tamsulosin (FLOMAX) 0.4 MG CAPS capsule Take 0.4 mg by mouth daily.    Marland Kitchen TAVALISSE 100 MG tablet     . topiramate (TOPAMAX) 200 MG tablet Take 200 mg by mouth 2 (two) times daily.    . vitamin B-12 (CYANOCOBALAMIN) 1000 MCG tablet Take by mouth.    . vortioxetine HBr (TRINTELLIX) 10 MG TABS tablet Take 1.5 tablets (15 mg total) by mouth daily. 45 tablet 2  . ciprofloxacin (CIPRO) 500 MG tablet Take 500 mg by mouth 2 (two) times daily.    . hydrochlorothiazide (HYDRODIURIL) 25 MG tablet Take 25 mg by mouth daily.    . hydrochlorothiazide (HYDRODIURIL) 25 MG tablet Take by mouth.    Marland Kitchen HYDROcodone-acetaminophen (NORCO) 10-325 MG tablet     . HYDROcodone-acetaminophen (NORCO) 7.5-325 MG tablet     . lamoTRIgine  (LAMICTAL) 100 MG tablet Take 100 mg by mouth 2 (two) times daily.    . mycophenolate (CELLCEPT) 500 MG tablet Take 2,000 mg by mouth 2 (two) times daily.     . ondansetron (ZOFRAN-ODT) 4 MG disintegrating tablet     . oxyCODONE (OXY IR/ROXICODONE) 5 MG immediate release tablet     . tamsulosin (FLOMAX) 0.4 MG CAPS capsule Take by mouth.     No current facility-administered medications for this visit.     Musculoskeletal: Strength & Muscle Tone: UTA Gait & Station: normal Patient leans: N/A  Psychiatric Specialty Exam: Review of Systems  Psychiatric/Behavioral: Positive for dysphoric mood.  All other systems reviewed and are negative.   There were no vitals taken for this visit.There is no height or weight on file to calculate BMI.  General Appearance: Casual  Eye Contact:  Fair  Speech:  Clear and Coherent  Volume:  Normal  Mood:  Dysphoric improving  Affect:  Congruent  Thought Process:  Goal Directed and Descriptions of Associations: Intact  Orientation:  Full (Time, Place, and Person)  Thought Content: Logical   Suicidal Thoughts:  No  Homicidal Thoughts:  No  Memory:  Immediate;   Fair Recent;   Fair Remote;   Fair  Judgement:  Fair  Insight:  Fair  Psychomotor Activity:  Normal  Concentration:  Concentration: Fair and Attention Span: Fair  Recall:  [employer] of Knowledge: Fair  Language: Fair  Akathisia:  No  Handed:  Right  AIMS (if indicated): Denies tremors, rigidity  Assets:  Communication Skills Desire for Improvement Housing Resilience Social Support  ADL's:  Intact  Cognition: WNL  Sleep:  Fair   Screenings: GAD-7     [facility] Visit [facility]/20/2019 in [facility]  Total GAD-7 Score  5    PHQ2-9     Office Visit from 03/20/2019 in [facility]  Visit from 04/14/2017 in [facility]  PHQ-2 Total Score  1  0  PHQ-9 Total Score  --  0       Assessment and Plan: Leslie [name] is  a 43 year old Caucasian female on disability, lives in [place], has a history of depression, anxiety, PTSD, seizure disorder, gastroesophageal reflux disease, ITP was evaluated by telemedicine today.  Patient is biologically predisposed given her family history, history of multiple health problems including pain.  Patient is currently making progress on the current medication regimen.  Plan as noted below.  Plan MDD-improving Trintellix 15 mg p.o. daily Zyprexa 2.5 mg p.o. nightly Discussed with her about tapering off of the Zyprexa if she continues to be stable.  Patient agrees with plan.  GAD-stable Patient referred for CBT with Ms. Tina Thompson-pending  PTSD-improving Patient advised to start CBT  Alcohol use disorder in remission-patient continues to stay sober.  Follow-up in clinic in 1 month or sooner if needed.  February 3 at 10 AM  I have spent atleast 15 minutes non face to face with patient today. More than 50 % of the time was spent for psychoeducation and supportive psychotherapy and care coordination. This note was generated in part or whole with voice recognition software. Voice recognition is usually quite accurate but there are transcription errors that can and very often do occur. I apologize for any typographical errors that were not detected and corrected.        Ursula Alert, MD 06/13/2019, 9:31 AM

## 2019-06-26 ENCOUNTER — Telehealth: Payer: Self-pay

## 2019-06-26 NOTE — Telephone Encounter (Signed)
approval notice for prior authorization [id number].  approved until 06-21-2020

## 2019-07-10 ENCOUNTER — Other Ambulatory Visit: Payer: Self-pay | Admitting: Psychiatry

## 2019-07-10 DIAGNOSIS — F4310 Post-traumatic stress disorder, unspecified: Secondary | ICD-10-CM

## 2019-07-10 DIAGNOSIS — F3341 Major depressive disorder, recurrent, in partial remission: Secondary | ICD-10-CM

## 2019-07-10 DIAGNOSIS — F411 Generalized anxiety disorder: Secondary | ICD-10-CM

## 2019-07-26 ENCOUNTER — Other Ambulatory Visit: Payer: Self-pay

## 2019-07-26 ENCOUNTER — Ambulatory Visit: Payer: Medicare Other | Admitting: Psychiatry

## 2019-07-27 ENCOUNTER — Encounter: Payer: Self-pay | Admitting: Psychiatry

## 2019-07-27 ENCOUNTER — Ambulatory Visit (INDEPENDENT_AMBULATORY_CARE_PROVIDER_SITE_OTHER): Payer: Medicare Other | Admitting: Psychiatry

## 2019-07-27 ENCOUNTER — Ambulatory Visit: Payer: Medicare Other | Admitting: Psychiatry

## 2019-07-27 DIAGNOSIS — F4310 Post-traumatic stress disorder, unspecified: Secondary | ICD-10-CM

## 2019-07-27 DIAGNOSIS — F1021 Alcohol dependence, in remission: Secondary | ICD-10-CM | POA: Diagnosis not present

## 2019-07-27 DIAGNOSIS — F330 Major depressive disorder, recurrent, mild: Secondary | ICD-10-CM

## 2019-07-27 DIAGNOSIS — F411 Generalized anxiety disorder: Secondary | ICD-10-CM | POA: Diagnosis not present

## 2019-07-27 MED ORDER — QUETIAPINE FUMARATE 25 MG PO TABS: 12.5000 mg | ORAL_TABLET | ORAL | 1 refills | Status: DC

## 2019-07-27 MED ORDER — VORTIOXETINE HBR 10 MG PO TABS: 15.0000 mg | ORAL_TABLET | Freq: Every day | ORAL | 2 refills | Status: DC

## 2019-07-27 NOTE — Progress Notes (Signed)
Provider Location : [facility] Patient Location : Home   Virtual Visit via Video Note  I connected with [name] on 07/27/19 at  1:20 PM EST by a video enabled telemedicine application and verified that I am speaking with the correct person using two identifiers.   I discussed the limitations of evaluation and management by telemedicine and the availability of in person appointments. The patient expressed understanding and agreed to proceed.    I discussed the assessment and treatment plan with the patient. The patient was provided an opportunity to ask questions and all were answered. The patient agreed with the plan and demonstrated an understanding of the instructions.   The patient was advised to call back or seek an in-person evaluation if the symptoms worsen or if the condition fails to improve as anticipated.   BH MD/PA/NP OP Progress Note  07/27/2019 2:03 PM JESSALYN HINOJOSA  MRN:  [id number]  Chief Complaint:  Chief Complaint    Follow-up     HPI: Denia is a 44 year old Caucasian female on disability, has a history of depression, GAD, PTSD, seizure disorder, iron deficiency anemia, ITP, history of subdural hemorrhage, chronic pain was evaluated by telemedicine today.  Patient today reports she is currently struggling with a lot of anxiety symptoms.  She reports that her [place] sponsor passed away from COVID-19.  She also reports a friend is currently admitted to the ICU due to COVID-19 infection.  She reports this is making her more and more anxious since she feels COVID-19 is real more than any time in the past.  She has a lot of rumination and racing thoughts about it.  She reports she was told by her provider that she should not get the COVID-19 vaccination.  She hence has been scared about what she is going to do without the vaccination.  She has been isolating herself and that has been extremely stressful for her.  Patient reports her Trintellix was approved and she is  happy about that.  She reports her depression is under control on the Trintellix and she does not want the medication to be changed.  Patient reports sleep is restless due to her anxiety.  She also reports she has extreme anxiety attacks during the day when she feels as though there is an elephant on her chest.  Patient has not started psychotherapy sessions  however agrees to reach out to Ms. Miguel Dibble to establish care.  She denies any suicidality, homicidality or perceptual disturbances. Visit Diagnosis:    ICD-10-CM   1. MDD (major depressive disorder), recurrent episode, mild (HCC)  F33.0 vortioxetine HBr (TRINTELLIX) 10 MG TABS tablet  2. GAD (generalized anxiety disorder)  F41.1 QUEtiapine (SEROQUEL) 25 MG tablet    vortioxetine HBr (TRINTELLIX) 10 MG TABS tablet  3. Alcohol use disorder, severe, in sustained remission ([place])  F10.21   4. PTSD (post-traumatic stress disorder)  F43.10 vortioxetine HBr (TRINTELLIX) 10 MG TABS tablet    Past Psychiatric History: Reviewed past psychiatric history from my progress note on 03/12/2019.  Past trials of Remeron, venlafaxine, Paxil, Lexapro, Effexor, Celexa, Wellbutrin, Abilify, Zyprexa.  Past Medical History:  Past Medical History:  Diagnosis Date  . Anemia   . Anxiety   . Epilepsy ([place])   . ITP (idiopathic thrombocytopenic purpura)     Past Surgical History:  Procedure Laterality Date  . ANKLE ARTHROSCOPY    . CHOLECYSTECTOMY    . GASTRIC BYPASS  2016  . KNEE ARTHROSCOPY    .  OPEN REDUCTION PROXIMAL HUMERUS FRACTURE  Left 3/218  . SPLENECTOMY, TOTAL  2006   for ITP  . TONSILLECTOMY    . TUBAL LIGATION  2009    Family Psychiatric History: Reviewed family psychiatric history from my progress note on 03/12/2019.  Family History:  Family History  Problem Relation Age of Onset  . Diabetes Father   . Hypertension Father   . Brain cancer Father        GBM  . Hypertension Mother   . Drug abuse Sister     Social History:  Reviewed social history from my progress note on 03/12/2019. Social History   Socioeconomic History  . Marital status: Divorced    Spouse name: Not on file  . Number of children: 1  . Years of education: Not on file  . Highest education level: Not on file  Occupational History  . Not on file  Tobacco Use  . Smoking status: Former Smoker    Types: Cigarettes    Quit date: 03/21/2013    Years since quitting: 6.3  . Smokeless tobacco: Never Used  Substance and Sexual Activity  . Alcohol use: No    Comment: hx of alcohol abuse- recovering alcoholic  . Drug use: No    Comment: hx of narcotic abuse  . Sexual activity: Never  Other Topics Concern  . Not on file  Social History Narrative  . Not on file   Social Determinants of Health   Financial Resource Strain:   . Difficulty of Paying Living Expenses: Not on file  Food Insecurity:   . Worried About Charity fundraiser in the Last Year: Not on file  . Ran Out of Food in the Last Year: Not on file  Transportation Needs:   . Lack of Transportation (Medical): Not on file  . Lack of Transportation (Non-Medical): Not on file  Physical Activity:   . Days of Exercise per Week: Not on file  . Minutes of Exercise per Session: Not on file  Stress:   . Feeling of Stress : Not on file  Social Connections:   . Frequency of Communication with Friends and Family: Not on file  . Frequency of Social Gatherings with Friends and Family: Not on file  . Attends Religious Services: Not on file  . Active Member of Clubs or Organizations: Not on file  . Attends Archivist Meetings: Not on file  . Marital Status: Not on file    Allergies:  Allergies  Allergen Reactions  . Sodium Ferric Gluconate [Ferrous Gluconate] Anaphylaxis  . Ace Inhibitors Cough  . Paroxetine Other (See Comments)    H/O ITP, bleeding risk  . Rituximab Other (See Comments)    Patient developed seizures; this happened previously with rituximab in 2014  .  Bupropion Other (See Comments) and Rash    H/o seizures Contraindicated due to seizures H/o seizures  . Venlafaxine Rash    Didn't work Didn't work Didn't work    Metabolic Disorder Labs: No results found for: HGBA1C, MPG No results found for: PROLACTIN No results found for: CHOL, TRIG, HDL, CHOLHDL, VLDL, LDLCALC No results found for: TSH  Therapeutic Level Labs: No results found for: LITHIUM No results found for: VALPROATE No components found for:  CBMZ  Current Medications: Current Outpatient Medications  Medication Sig Dispense Refill  . acetaminophen (TYLENOL) 325 MG tablet Take by mouth.    . cloBAZam (ONFI) 10 MG tablet Take by mouth.    . cloBAZam (ONFI) 10  MG tablet     . eltrombopag (PROMACTA) 50 MG tablet TAKE 2 TABLETS PO ONCE DAILY FOR 30 DAYS. TAKE ON AN EMPTY STOMACH 1 HOUR BEFORE OR 2 HOURS AFTER A MEAL    . EPINEPHrine 0.3 mg/0.3 mL IJ SOAJ injection Inject into the muscle.    . fostamatinib disodium (TAVALISSE) 100 MG tablet Take by mouth.    . gabapentin (NEURONTIN) 300 MG capsule     . hydrochlorothiazide (HYDRODIURIL) 25 MG tablet Take 25 mg by mouth daily.    . hydrochlorothiazide (HYDRODIURIL) 25 MG tablet Take by mouth.    . hydrocortisone-pramoxine (ANALPRAM-HC) 2.5-1 % rectal cream Three times daily as needed.    Marland Kitchen morphine (MSIR) 15 MG tablet     . morphine (MSIR) 30 MG tablet     . naloxone (NARCAN) nasal spray 4 mg/0.1 mL One spray in nostril if patient is not breathing; call 911; if needed repeat after 2 minutes    . ondansetron (ZOFRAN-ODT) 4 MG disintegrating tablet     . polyethylene glycol (MIRALAX / GLYCOLAX) 17 g packet Take by mouth.    . potassium chloride (KLOR-CON) 20 MEQ packet Take 20 mEq by mouth daily.    . Pramoxine-HC (HYDROCORTISONE ACE-PRAMOXINE) 2.5-1 % CREA Three times daily as needed.    . predniSONE (DELTASONE) 20 MG tablet     . predniSONE (DELTASONE) 20 MG tablet     . PROMACTA 50 MG tablet Take 100 mg by mouth daily.     . tamsulosin (FLOMAX) 0.4 MG CAPS capsule Take by mouth.    . tamsulosin (FLOMAX) 0.4 MG CAPS capsule Take 0.4 mg by mouth daily.    Marland Kitchen TAVALISSE 100 MG tablet     . topiramate (TOPAMAX) 200 MG tablet Take 200 mg by mouth 2 (two) times daily.    . vitamin B-12 (CYANOCOBALAMIN) 1000 MCG tablet Take by mouth.    . DEXAMETHASONE PO Take 40 mg by mouth daily.    Marland Kitchen HYDROcodone-acetaminophen (NORCO) 10-325 MG tablet     . HYDROcodone-acetaminophen (NORCO) 7.5-325 MG tablet     . mycophenolate (CELLCEPT) 500 MG tablet Take 2,000 mg by mouth 2 (two) times daily.     Marland Kitchen oxyCODONE (OXY IR/ROXICODONE) 5 MG immediate release tablet     . QUEtiapine (SEROQUEL) 25 MG tablet Take 0.5-1 tablets (12.5-25 mg total) by mouth as directed. Take one tablet at bedtime and half tablet ad needed daily for severe anxiety/agitation 45 tablet 1  . vortioxetine HBr (TRINTELLIX) 10 MG TABS tablet Take 1.5 tablets (15 mg total) by mouth daily. 45 tablet 2   No current facility-administered medications for this visit.     Musculoskeletal: Strength & Muscle Tone: UTA Gait & Station: normal Patient leans: N/A  Psychiatric Specialty Exam: Review of Systems  Psychiatric/Behavioral: Positive for sleep disturbance. The patient is nervous/anxious.   All other systems reviewed and are negative.   There were no vitals taken for this visit.There is no height or weight on file to calculate BMI.  General Appearance: Casual  Eye Contact:  Fair  Speech:  Clear and Coherent  Volume:  Normal  Mood:  Anxious  Affect:  Appropriate  Thought Process:  Goal Directed and Descriptions of Associations: Intact  Orientation:  Full (Time, Place, and Person)  Thought Content: Logical   Suicidal Thoughts:  No  Homicidal Thoughts:  No  Memory:  Immediate;   Fair Recent;   Fair Remote;   Fair  Judgement:  Fair  Insight:  Fair  Psychomotor Activity:  Normal  Concentration:  Concentration: Fair and Attention Span: Fair  Recall:  [employer] of Knowledge: Fair  Language: Fair  Akathisia:  No  Handed:  Right  AIMS (if indicated): Denies tremors, rigidity  Assets:  Communication Skills Desire for Improvement Housing Social Support  ADL's:  Intact  Cognition: WNL  Sleep:  restless   Screenings: GAD-7     Office Visit from 03/20/2019 in [facility]  Total GAD-7 Score  5    PHQ2-9     Office Visit from 03/20/2019 in [facility] Office Visit from 04/14/2017 in [facility]  PHQ-2 Total Score  1  0  PHQ-9 Total Score  --  0       Assessment and Plan: Cielo is a 44 year old Caucasian female on disability, lives in [place], has a history of depression, anxiety, PTSD, seizure disorder, GERD, ITP was evaluated by telemedicine today.  She is biologically predisposed given her family history, history of multiple health problems including pain.  She is currently struggling with anxiety symptoms due to the COVID-19 pandemic and death of her sponsor.  Patient will benefit from medication readjustment and psychotherapy sessions.  Plan as noted below.  Plan MDD-stable Trintellix 0.15 mg p.o. daily.  GAD-unstable Stop Zyprexa. Start Seroquel 25 mg at bedtime. Seroquel 12.5 mg p.o. daily as needed for severe anxiety. Refer for CBT.Patient to reach out to [facility]. We will consider propanolol during the day if Seroquel makes her drowsy.  PTSD-improving Patient referred for CBT  Alcohol use disorder in remission-she continues to be sober.  Follow-up in clinic in 3 weeks or sooner if needed.  February 23 at 10 AM  I have spent atleast 20 minutes non face to face with patient today. More than 50 % of the time was spent for ordering medications and test ,psychoeducation and supportive psychotherapy and care coordination,as well as documenting clinical information in electronic health record. This note was generated in part or whole with voice recognition  software. Voice recognition is usually quite accurate but there are transcription errors that can and very often do occur. I apologize for any typographical errors that were not detected and corrected.       Ursula Alert, MD 07/27/2019, 2:03 PM

## 2019-08-15 ENCOUNTER — Ambulatory Visit (INDEPENDENT_AMBULATORY_CARE_PROVIDER_SITE_OTHER): Payer: Medicare Other | Admitting: Psychiatry

## 2019-08-15 ENCOUNTER — Other Ambulatory Visit: Payer: Self-pay

## 2019-08-15 ENCOUNTER — Encounter: Payer: Self-pay | Admitting: Psychiatry

## 2019-08-15 DIAGNOSIS — F411 Generalized anxiety disorder: Secondary | ICD-10-CM

## 2019-08-15 DIAGNOSIS — F330 Major depressive disorder, recurrent, mild: Secondary | ICD-10-CM | POA: Diagnosis not present

## 2019-08-15 DIAGNOSIS — F1021 Alcohol dependence, in remission: Secondary | ICD-10-CM | POA: Diagnosis not present

## 2019-08-15 DIAGNOSIS — F4310 Post-traumatic stress disorder, unspecified: Secondary | ICD-10-CM

## 2019-08-15 NOTE — Progress Notes (Signed)
Provider Location : [facility] Patient Location : Home  Virtual Visit via Video Note  I connected with Leslie [name] on 08/15/19 at 10:00 AM EST by a video enabled telemedicine application and verified that I am speaking with the correct person using two identifiers.   I discussed the limitations of evaluation and management by telemedicine and the availability of in person appointments. The patient expressed understanding and agreed to proceed.     I discussed the assessment and treatment plan with the patient. The patient was provided an opportunity to ask questions and all were answered. The patient agreed with the plan and demonstrated an understanding of the instructions.   The patient was advised to call back or seek an in-person evaluation if the symptoms worsen or if the condition fails to improve as anticipated.   [facility] MD OP Progress Note  08/15/2019 12:35 PM Leslie [name]  MRN:  [id number]  Chief Complaint:  Chief Complaint    Follow-up     HPI: Leslie [name] is a 44 year old Caucasian female on disability, history of depression, GAD, PTSD, seizure disorder, iron deficiency anemia, ITP, history of subdural hemorrhage, chronic pain was evaluated by telemedicine today.  Patient today reports she is tolerating the Seroquel well.  She is able to better sleep at night.  That is an improvement for her.  She denies any side effects to the Seroquel at this time.  She continues to be compliant on Trintellix.  She reports her anxiety symptoms are improving.  Patient denies any suicidality, homicidality or perceptual disturbances.  Patient reports she was recently started on Belbuca for pain and that is helpful.  She reports she continues to look for a therapist and has been unable to find one.  She is currently looking for a therapist with [facility] since her son sees someone there.  Patient denies any other concerns today. Visit Diagnosis:    ICD-10-CM   1. MDD (major  depressive disorder), recurrent episode, mild ([place])  F33.0   2. GAD (generalized anxiety disorder)  F41.1   3. Alcohol use disorder, severe, in sustained remission ([place])  F10.21   4. PTSD (post-traumatic stress disorder)  F43.10     Past Psychiatric History: I have reviewed past psychiatric history from my progress note on 03/12/2019.  Past trials of Remeron, venlafaxine, Paxil, Lexapro, Effexor, Celexa, Wellbutrin, Abilify, Zyprexa  Past Medical History:  Past Medical History:  Diagnosis Date  . Anemia   . Anxiety   . Epilepsy ([place])   . ITP (idiopathic thrombocytopenic purpura)     Past Surgical History:  Procedure Laterality Date  . ANKLE ARTHROSCOPY    . CHOLECYSTECTOMY    . GASTRIC BYPASS  2016  . KNEE ARTHROSCOPY    . OPEN REDUCTION PROXIMAL HUMERUS FRACTURE  Left 3/218  . SPLENECTOMY, TOTAL  2006   for ITP  . TONSILLECTOMY    . TUBAL LIGATION  2009    Family Psychiatric History: Reviewed family psychiatric history from my progress note on 03/12/2019  Family History:  Family History  Problem Relation Age of Onset  . Diabetes Father   . Hypertension Father   . Brain cancer Father        GBM  . Hypertension Mother   . Drug abuse Sister     Social History: Reviewed social history from my progress note on 03/12/2019 Social History   Socioeconomic History  . Marital status: Divorced    Spouse name: Not on file  . Number of children:  1  . Years of education: Not on file  . Highest education level: Not on file  Occupational History  . Not on file  Tobacco Use  . Smoking status: Former Smoker    Types: Cigarettes    Quit date: 03/21/2013    Years since quitting: 6.4  . Smokeless tobacco: Never Used  Substance and Sexual Activity  . Alcohol use: No    Comment: hx of alcohol abuse- recovering alcoholic  . Drug use: No    Comment: hx of narcotic abuse  . Sexual activity: Never  Other Topics Concern  . Not on file  Social History Narrative  . Not on file    Social Determinants of Health   Financial Resource Strain:   . Difficulty of Paying Living Expenses: Not on file  Food Insecurity:   . Worried About Charity fundraiser in the Last Year: Not on file  . Ran Out of Food in the Last Year: Not on file  Transportation Needs:   . Lack of Transportation (Medical): Not on file  . Lack of Transportation (Non-Medical): Not on file  Physical Activity:   . Days of Exercise per Week: Not on file  . Minutes of Exercise per Session: Not on file  Stress:   . Feeling of Stress : Not on file  Social Connections:   . Frequency of Communication with Friends and Family: Not on file  . Frequency of Social Gatherings with Friends and Family: Not on file  . Attends Religious Services: Not on file  . Active Member of Clubs or Organizations: Not on file  . Attends Archivist Meetings: Not on file  . Marital Status: Not on file    Allergies:  Allergies  Allergen Reactions  . Sodium Ferric Gluconate [Ferrous Gluconate] Anaphylaxis  . Ace Inhibitors Cough  . Paroxetine Other (See Comments)    H/O ITP, bleeding risk  . Rituximab Other (See Comments)    Patient developed seizures; this happened previously with rituximab in 2014  . Bupropion Other (See Comments) and Rash    H/o seizures Contraindicated due to seizures H/o seizures  . Venlafaxine Rash    Didn't work Didn't work Didn't work    Metabolic Disorder Labs: No results found for: HGBA1C, MPG No results found for: PROLACTIN No results found for: CHOL, TRIG, HDL, CHOLHDL, VLDL, LDLCALC No results found for: TSH  Therapeutic Level Labs: No results found for: LITHIUM No results found for: VALPROATE No components found for:  CBMZ  Current Medications: Current Outpatient Medications  Medication Sig Dispense Refill  . Buprenorphine HCl (BELBUCA) 900 MCG FILM Place inside cheek.    Marland Kitchen acetaminophen (TYLENOL) 325 MG tablet Take by mouth.    . BELBUCA 900 MCG FILM Take 1 strip  by mouth 2 (two) times daily.    . cloBAZam (ONFI) 10 MG tablet Take by mouth.    . cloBAZam (ONFI) 10 MG tablet     . DEXAMETHASONE PO Take 40 mg by mouth daily.    Marland Kitchen eltrombopag (PROMACTA) 50 MG tablet TAKE 2 TABLETS PO ONCE DAILY FOR 30 DAYS. TAKE ON AN EMPTY STOMACH 1 HOUR BEFORE OR 2 HOURS AFTER A MEAL    . EPINEPHrine 0.3 mg/0.3 mL IJ SOAJ injection Inject into the muscle.    . fostamatinib disodium (TAVALISSE) 100 MG tablet Take by mouth.    . gabapentin (NEURONTIN) 300 MG capsule     . hydrochlorothiazide (HYDRODIURIL) 25 MG tablet Take 25 mg by mouth  daily.    . hydrochlorothiazide (HYDRODIURIL) 25 MG tablet Take by mouth.    Marland Kitchen HYDROcodone-acetaminophen (NORCO) 10-325 MG tablet     . HYDROcodone-acetaminophen (NORCO) 7.5-325 MG tablet     . hydrocortisone-pramoxine (ANALPRAM-HC) 2.5-1 % rectal cream Three times daily as needed.    Marland Kitchen morphine (MSIR) 15 MG tablet     . morphine (MSIR) 30 MG tablet     . mycophenolate (CELLCEPT) 500 MG tablet Take 2,000 mg by mouth 2 (two) times daily.     . naloxone (NARCAN) nasal spray 4 mg/0.1 mL One spray in nostril if patient is not breathing; call 911; if needed repeat after 2 minutes    . NAYZILAM 5 MG/0.1ML SOLN     . ondansetron (ZOFRAN-ODT) 4 MG disintegrating tablet     . oxyCODONE (OXY IR/ROXICODONE) 5 MG immediate release tablet     . polyethylene glycol (MIRALAX / GLYCOLAX) 17 g packet Take by mouth.    . potassium chloride (KLOR-CON) 20 MEQ packet Take 20 mEq by mouth daily.    . Pramoxine-HC (HYDROCORTISONE ACE-PRAMOXINE) 2.5-1 % CREA Three times daily as needed.    . predniSONE (DELTASONE) 20 MG tablet     . predniSONE (DELTASONE) 20 MG tablet     . PROMACTA 50 MG tablet Take 100 mg by mouth daily.    . QUEtiapine (SEROQUEL) 25 MG tablet Take 0.5-1 tablets (12.5-25 mg total) by mouth as directed. Take one tablet at bedtime and half tablet ad needed daily for severe anxiety/agitation 45 tablet 1  . tamsulosin (FLOMAX) 0.4 MG CAPS  capsule Take by mouth.    . tamsulosin (FLOMAX) 0.4 MG CAPS capsule Take 0.4 mg by mouth daily.    Marland Kitchen TAVALISSE 100 MG tablet     . topiramate (TOPAMAX) 200 MG tablet Take 200 mg by mouth 2 (two) times daily.    . vitamin B-12 (CYANOCOBALAMIN) 1000 MCG tablet Take by mouth.    . vortioxetine HBr (TRINTELLIX) 10 MG TABS tablet Take 1.5 tablets (15 mg total) by mouth daily. 45 tablet 2   No current facility-administered medications for this visit.     Musculoskeletal: Strength & Muscle Tone: UTA Gait & Station: normal Patient leans: N/A  Psychiatric Specialty Exam: Review of Systems  Musculoskeletal: Positive for arthralgias.  Psychiatric/Behavioral: The patient is nervous/anxious.   All other systems reviewed and are negative.   There were no vitals taken for this visit.There is no height or weight on file to calculate BMI.  General Appearance: Casual  Eye Contact:  Fair  Speech:  Normal Rate  Volume:  Normal  Mood:  Anxious  Affect:  Congruent  Thought Process:  Goal Directed and Descriptions of Associations: Intact  Orientation:  Full (Time, Place, and Person)  Thought Content: Logical   Suicidal Thoughts:  No  Homicidal Thoughts:  No  Memory:  Immediate;   Fair Recent;   Fair Remote;   Fair  Judgement:  Fair  Insight:  Fair  Psychomotor Activity:  Normal  Concentration:  Concentration: Fair and Attention Span: Fair  Recall:  [employer] of Knowledge: Fair  Language: Fair  Akathisia:  No  Handed:  Right  AIMS (if indicated): UTA  Assets:  Communication Skills Desire for Improvement Housing Social Support  ADL's:  Intact  Cognition: WNL  Sleep:  Fair   Screenings: GAD-7     Office Visit from 03/20/2019 in [facility]  Total GAD-7 Score  5    PHQ2-9  [facility] Visit [facility] 03/20/2019 in [facility] Office Visit from 04/14/2017 in [facility]  PHQ-2 Total Score  1  0  PHQ-9 Total Score  --  0        Assessment and Plan: Analina is a 45 year old Caucasian female on disability, lives in [place], has a history of depression, anxiety, PTSD, seizure disorder, GERD, ITP was evaluated by telemedicine today.  She is biologically predisposed given her family history, history of multiple health problems including pain.  Patient is currently making progress on the current medication regimen.  However will benefit from psychotherapy sessions.  Plan as noted below.  Plan MDD-stable Trintellix 15 mg p.o. daily  GAD-improving Seroquel 25 mg at bedtime Seroquel 12.5 mg p.o. daily as needed for severe anxiety attacks Provided information for psychology today to find a therapist.  She is also looking at [facility].  PTSD-improving Patient referred for CBT  Alcohol use disorder in remission-she continues to remain sober  Follow-up in clinic in 6 weeks or sooner if needed.  April 6 at 10:20 AM  I have spent atleast 20 minutes non face to face with patient today. More than 50 % of the time was spent for ordering medications and test ,psychoeducation and supportive psychotherapy and care coordination,as well as documenting clinical information in electronic health record. This note was generated in part or whole with voice recognition software. Voice recognition is usually quite accurate but there are transcription errors that can and very often do occur. I apologize for any typographical errors that were not detected and corrected.       Jomarie Longs, MD 08/15/2019, 12:35 PM

## 2019-09-04 DIAGNOSIS — Z79891 Long term (current) use of opiate analgesic: Secondary | ICD-10-CM | POA: Insufficient documentation

## 2019-09-04 DIAGNOSIS — Z5181 Encounter for therapeutic drug level monitoring: Secondary | ICD-10-CM | POA: Insufficient documentation

## 2019-09-18 ENCOUNTER — Other Ambulatory Visit: Payer: Self-pay | Admitting: Psychiatry

## 2019-09-18 DIAGNOSIS — F411 Generalized anxiety disorder: Secondary | ICD-10-CM

## 2019-09-26 ENCOUNTER — Ambulatory Visit (INDEPENDENT_AMBULATORY_CARE_PROVIDER_SITE_OTHER): Payer: Medicare Other | Admitting: Psychiatry

## 2019-09-26 ENCOUNTER — Other Ambulatory Visit: Payer: Self-pay

## 2019-09-26 ENCOUNTER — Encounter: Payer: Self-pay | Admitting: Psychiatry

## 2019-09-26 DIAGNOSIS — F3342 Major depressive disorder, recurrent, in full remission: Secondary | ICD-10-CM | POA: Diagnosis not present

## 2019-09-26 DIAGNOSIS — F4310 Post-traumatic stress disorder, unspecified: Secondary | ICD-10-CM | POA: Diagnosis not present

## 2019-09-26 DIAGNOSIS — F411 Generalized anxiety disorder: Secondary | ICD-10-CM

## 2019-09-26 DIAGNOSIS — F1021 Alcohol dependence, in remission: Secondary | ICD-10-CM

## 2019-09-26 NOTE — Progress Notes (Signed)
Provider Location : [facility] Patient Location : Home  Virtual Visit via Video Note  I connected with Leslie [name] on 09/26/19 at 10:20 AM EDT by a video enabled telemedicine application and verified that I am speaking with the correct person using two identifiers.   I discussed the limitations of evaluation and management by telemedicine and the availability of in person appointments. The patient expressed understanding and agreed to proceed.   I discussed the assessment and treatment plan with the patient. The patient was provided an opportunity to ask questions and all were answered. The patient agreed with the plan and demonstrated an understanding of the instructions.   The patient was advised to call back or seek an in-person evaluation if the symptoms worsen or if the condition fails to improve as anticipated.  Perth Amboy MD OP Progress Note  09/26/2019 12:18 PM TEAGYN FISHEL  MRN:  [id number]  Chief Complaint:  Chief Complaint    Follow-up     HPI: Leslie [name] is a 44 year old Caucasian female on disability, history of depression, GAD, PTSD, seizure disorder, iron deficiency anemia, ITP, history of subdural hemorrhage, chronic pain was evaluated by telemedicine today.  Patient today reports she is currently doing well with regards to her mood.  She denies any significant anxiety or depressive symptoms.  She reports sleep is good.  She takes her Seroquel at around 8 PM.  If she takes it later than that she is unable to wake up in the morning and feels groggy.  She denies any suicidality, homicidality or perceptual disturbances.  Patient reports she was recently told she may have elevated liver function and hence has upcoming appointment with her providers for the same.  She was finally able to find a therapist with [facility].  She has upcoming appointment and she is motivated to keep it.  Patient denies any other concerns today. Visit Diagnosis:    ICD-10-CM   1. MDD (major  depressive disorder), recurrent, in full remission ([place])  F33.42   2. GAD (generalized anxiety disorder)  F41.1   3. Alcohol use disorder, severe, in sustained remission ([place])  F10.21   4. PTSD (post-traumatic stress disorder)  F43.10     Past Psychiatric History: I have reviewed past psychiatric history from my progress note on 03/12/2019.  Past trials of Remeron, venlafaxine, Paxil, Lexapro, Effexor, Celexa, Wellbutrin, Abilify, Zyprexa  Past Medical History:  Past Medical History:  Diagnosis Date  . Anemia   . Anxiety   . Epilepsy ([place])   . ITP (idiopathic thrombocytopenic purpura)     Past Surgical History:  Procedure Laterality Date  . ANKLE ARTHROSCOPY    . CHOLECYSTECTOMY    . GASTRIC BYPASS  2016  . KNEE ARTHROSCOPY    . OPEN REDUCTION PROXIMAL HUMERUS FRACTURE  Left 3/218  . SPLENECTOMY, TOTAL  2006   for ITP  . TONSILLECTOMY    . TUBAL LIGATION  2009    Family Psychiatric History: I have reviewed family psychiatric history from my progress note on 03/12/2019 Family History:  Family History  Problem Relation Age of Onset  . Diabetes Father   . Hypertension Father   . Brain cancer Father        GBM  . Hypertension Mother   . Drug abuse Sister     Social History: I have reviewed social history from my progress note on 03/12/2019 Social History   Socioeconomic History  . Marital status: Divorced    Spouse name: Not on file  .  Number of children: 1  . Years of education: Not on file  . Highest education level: Not on file  Occupational History  . Not on file  Tobacco Use  . Smoking status: Former Smoker    Types: Cigarettes    Quit date: 03/21/2013    Years since quitting: 6.5  . Smokeless tobacco: Never Used  Substance and Sexual Activity  . Alcohol use: No    Comment: hx of alcohol abuse- recovering alcoholic  . Drug use: No    Comment: hx of narcotic abuse  . Sexual activity: Never  Other Topics Concern  . Not on file  Social History Narrative  .  Not on file   Social Determinants of Health   Financial Resource Strain:   . Difficulty of Paying Living Expenses:   Food Insecurity:   . Worried About Charity fundraiser in the Last Year:   . Arboriculturist in the Last Year:   Transportation Needs:   . Film/video editor (Medical):   Marland Kitchen Lack of Transportation (Non-Medical):   Physical Activity:   . Days of Exercise per Week:   . Minutes of Exercise per Session:   Stress:   . Feeling of Stress :   Social Connections:   . Frequency of Communication with Friends and Family:   . Frequency of Social Gatherings with Friends and Family:   . Attends Religious Services:   . Active Member of Clubs or Organizations:   . Attends Archivist Meetings:   Marland Kitchen Marital Status:     Allergies:  Allergies  Allergen Reactions  . Sodium Ferric Gluconate [Ferrous Gluconate] Anaphylaxis  . Ace Inhibitors Cough  . Paroxetine Other (See Comments)    H/O ITP, bleeding risk  . Rituximab Other (See Comments)    Patient developed seizures; this happened previously with rituximab in 2014  . Bupropion Other (See Comments) and Rash    H/o seizures Contraindicated due to seizures H/o seizures  . Venlafaxine Rash    Didn't work Didn't work Didn't work    Metabolic Disorder Labs: No results found for: HGBA1C, MPG No results found for: PROLACTIN No results found for: CHOL, TRIG, HDL, CHOLHDL, VLDL, LDLCALC No results found for: TSH  Therapeutic Level Labs: No results found for: LITHIUM No results found for: VALPROATE No components found for:  CBMZ  Current Medications: Current Outpatient Medications  Medication Sig Dispense Refill  . Buprenorphine HCl (BELBUCA) 900 MCG FILM Place inside cheek.    . gabapentin (NEURONTIN) 300 MG capsule Take by mouth.    . morphine (MSIR) 15 MG tablet Take 1 tablet every 4 hours as needed. Maximum 5 per day    . naloxone (NARCAN) nasal spray 4 mg/0.1 mL One spray in nostril if patient is not  breathing; call 911; if needed repeat after 2 minutes    . topiramate (TOPAMAX) 200 MG tablet Take by mouth.    Marland Kitchen acetaminophen (TYLENOL) 325 MG tablet Take by mouth.    . BELBUCA 900 MCG FILM Take 1 strip by mouth 2 (two) times daily.    . cloBAZam (ONFI) 10 MG tablet Take by mouth.    . cloBAZam (ONFI) 10 MG tablet     . DEXAMETHASONE PO Take 40 mg by mouth daily.    Marland Kitchen eltrombopag (PROMACTA) 50 MG tablet TAKE 2 TABLETS PO ONCE DAILY FOR 30 DAYS. TAKE ON AN EMPTY STOMACH 1 HOUR BEFORE OR 2 HOURS AFTER A MEAL    .  EPINEPHrine 0.3 mg/0.3 mL IJ SOAJ injection Inject into the muscle.    . fostamatinib disodium (TAVALISSE) 100 MG tablet Take by mouth.    . gabapentin (NEURONTIN) 300 MG capsule     . hydrochlorothiazide (HYDRODIURIL) 25 MG tablet Take 25 mg by mouth daily.    . hydrochlorothiazide (HYDRODIURIL) 25 MG tablet Take by mouth.    Marland Kitchen HYDROcodone-acetaminophen (NORCO) 10-325 MG tablet     . HYDROcodone-acetaminophen (NORCO) 7.5-325 MG tablet     . hydrocortisone-pramoxine (ANALPRAM-HC) 2.5-1 % rectal cream Three times daily as needed.    Marland Kitchen morphine (MSIR) 15 MG tablet     . morphine (MSIR) 30 MG tablet     . mycophenolate (CELLCEPT) 500 MG tablet Take 2,000 mg by mouth 2 (two) times daily.     . naloxone (NARCAN) nasal spray 4 mg/0.1 mL One spray in nostril if patient is not breathing; call 911; if needed repeat after 2 minutes    . NAYZILAM 5 MG/0.1ML SOLN     . ondansetron (ZOFRAN-ODT) 4 MG disintegrating tablet     . oxyCODONE (OXY IR/ROXICODONE) 5 MG immediate release tablet     . polyethylene glycol (MIRALAX / GLYCOLAX) 17 g packet Take by mouth.    . potassium chloride (KLOR-CON) 20 MEQ packet Take 20 mEq by mouth daily.    . Pramoxine-HC (HYDROCORTISONE ACE-PRAMOXINE) 2.5-1 % CREA Three times daily as needed.    . predniSONE (DELTASONE) 20 MG tablet     . predniSONE (DELTASONE) 20 MG tablet     . PROMACTA 50 MG tablet Take 100 mg by mouth daily.    . QUEtiapine (SEROQUEL) 25  MG tablet TAKE 1 TABLET AT BEDTIME AND 1/2 TABLET AS NEEDED DAILY FOR SEVERE ANXIETY/AGITATION. 45 tablet 1  . tamsulosin (FLOMAX) 0.4 MG CAPS capsule Take by mouth.    . tamsulosin (FLOMAX) 0.4 MG CAPS capsule Take 0.4 mg by mouth daily.    Marland Kitchen TAVALISSE 100 MG tablet     . topiramate (TOPAMAX) 200 MG tablet Take 200 mg by mouth 2 (two) times daily.    . vitamin B-12 (CYANOCOBALAMIN) 1000 MCG tablet Take by mouth.    . vortioxetine HBr (TRINTELLIX) 10 MG TABS tablet Take 1.5 tablets (15 mg total) by mouth daily. 45 tablet 2   No current facility-administered medications for this visit.     Musculoskeletal: Strength & Muscle Tone: UTA Gait & Station: normal Patient leans: N/A  Psychiatric Specialty Exam: Review of Systems  Psychiatric/Behavioral: Negative for agitation, behavioral problems, confusion, decreased concentration, dysphoric mood, hallucinations, self-injury, sleep disturbance and suicidal ideas. The patient is not nervous/anxious and is not hyperactive.   All other systems reviewed and are negative.   There were no vitals taken for this visit.There is no height or weight on file to calculate BMI.  General Appearance: Casual  Eye Contact:  Fair  Speech:  Normal Rate  Volume:  Normal  Mood:  Euthymic  Affect:  Congruent  Thought Process:  Goal Directed and Descriptions of Associations: Intact  Orientation:  Full (Time, Place, and Person)  Thought Content: Logical   Suicidal Thoughts:  No  Homicidal Thoughts:  No  Memory:  Immediate;   Fair Recent;   Fair Remote;   Fair  Judgement:  Fair  Insight:  Fair  Psychomotor Activity:  Normal  Concentration:  Concentration: Fair and Attention Span: Fair  Recall:  [employer] of Knowledge: Fair  Language: Fair  Akathisia:  No  Handed:  Right  AIMS (if indicated): UTA  Assets:  Communication Skills Desire for Improvement Housing Social Support  ADL's:  Intact  Cognition: WNL  Sleep:  Fair   Screenings: GAD-7      [facility] Visit from 03/20/2019 in [facility]  Total GAD-7 Score  5    PHQ2-9     Office Visit from 03/20/2019 in [facility] Office Visit from 04/14/2017 in [facility]  PHQ-2 Total Score  1  0  PHQ-9 Total Score  --  0       Assessment and Plan: Leslie [name] is a 44 year old Caucasian female on disability, lives in [place], has a history of depression, anxiety, PTSD, seizure disorder, GERD, ITP was evaluated by telemedicine today.  She is biologically predisposed given her family history, history of multiple health problems including pain.  Patient is currently making progress with regards to her mood symptoms.  Plan as noted below.  Plan MDD-stable Trintellix 15 mg p.o. daily  GAD-improving Seroquel 25 mg at bedtime Seroquel 12.5 mg p.o. daily as needed for severe anxiety attacks Patient is scheduled to start psychotherapy sessions with [facility]-Ms. Annice Pih.  PTSD-improving Patient referred for CBT  Alcohol use disorder in remission-she continues to remain sober  Patient with abnormal LFT as well as hypokalemia-encouraged her to contact her primary care provider/GI specialist.  Follow-up in clinic in 6 weeks or sooner if needed.  I have spent atleast 20 minutes non face to face with patient today. More than 50 % of the time was spent for preparing to see the patient ( e.g., review of test, records ), ordering medications and test ,psychoeducation and supportive psychotherapy and care coordination,as well as documenting clinical information in electronic health record. This note was generated in part or whole with voice recognition software. Voice recognition is usually quite accurate but there are transcription errors that can and very often do occur. I apologize for any typographical errors that were not detected and corrected.       Jomarie Longs, MD 09/26/2019, 12:18 PM

## 2019-10-16 ENCOUNTER — Other Ambulatory Visit: Payer: Self-pay | Admitting: Psychiatry

## 2019-10-16 DIAGNOSIS — F411 Generalized anxiety disorder: Secondary | ICD-10-CM

## 2019-11-13 ENCOUNTER — Telehealth (INDEPENDENT_AMBULATORY_CARE_PROVIDER_SITE_OTHER): Payer: Medicare Other | Admitting: Psychiatry

## 2019-11-13 ENCOUNTER — Other Ambulatory Visit: Payer: Self-pay

## 2019-11-13 DIAGNOSIS — Z5329 Procedure and treatment not carried out because of patient's decision for other reasons: Secondary | ICD-10-CM

## 2019-11-13 NOTE — Progress Notes (Signed)
No response to call or text. 

## 2019-12-04 ENCOUNTER — Other Ambulatory Visit: Payer: Self-pay | Admitting: Psychiatry

## 2019-12-04 DIAGNOSIS — F411 Generalized anxiety disorder: Secondary | ICD-10-CM

## 2020-01-03 ENCOUNTER — Other Ambulatory Visit: Payer: Self-pay | Admitting: Child and Adolescent Psychiatry

## 2020-01-03 DIAGNOSIS — F411 Generalized anxiety disorder: Secondary | ICD-10-CM

## 2020-02-05 ENCOUNTER — Other Ambulatory Visit: Payer: Self-pay | Admitting: Psychiatry

## 2020-02-05 DIAGNOSIS — F4310 Post-traumatic stress disorder, unspecified: Secondary | ICD-10-CM

## 2020-02-05 DIAGNOSIS — F330 Major depressive disorder, recurrent, mild: Secondary | ICD-10-CM

## 2020-02-05 DIAGNOSIS — F411 Generalized anxiety disorder: Secondary | ICD-10-CM

## 2020-03-04 ENCOUNTER — Other Ambulatory Visit: Payer: Self-pay | Admitting: Psychiatry

## 2020-03-04 DIAGNOSIS — F330 Major depressive disorder, recurrent, mild: Secondary | ICD-10-CM

## 2020-03-04 DIAGNOSIS — F4310 Post-traumatic stress disorder, unspecified: Secondary | ICD-10-CM

## 2020-03-04 DIAGNOSIS — F411 Generalized anxiety disorder: Secondary | ICD-10-CM

## 2020-06-10 ENCOUNTER — Other Ambulatory Visit: Payer: Self-pay | Admitting: Psychiatry

## 2020-06-10 DIAGNOSIS — F330 Major depressive disorder, recurrent, mild: Secondary | ICD-10-CM

## 2020-06-10 DIAGNOSIS — F4310 Post-traumatic stress disorder, unspecified: Secondary | ICD-10-CM

## 2020-06-10 DIAGNOSIS — F411 Generalized anxiety disorder: Secondary | ICD-10-CM

## 2020-06-26 DIAGNOSIS — Z79891 Long term (current) use of opiate analgesic: Secondary | ICD-10-CM | POA: Diagnosis not present

## 2020-06-26 DIAGNOSIS — M19171 Post-traumatic osteoarthritis, right ankle and foot: Secondary | ICD-10-CM | POA: Diagnosis not present

## 2020-06-26 DIAGNOSIS — M19071 Primary osteoarthritis, right ankle and foot: Secondary | ICD-10-CM | POA: Diagnosis not present

## 2020-06-26 DIAGNOSIS — Z5181 Encounter for therapeutic drug level monitoring: Secondary | ICD-10-CM | POA: Diagnosis not present

## 2020-06-26 DIAGNOSIS — Z4789 Encounter for other orthopedic aftercare: Secondary | ICD-10-CM | POA: Diagnosis not present

## 2020-06-26 DIAGNOSIS — G8918 Other acute postprocedural pain: Secondary | ICD-10-CM | POA: Diagnosis not present

## 2020-06-26 DIAGNOSIS — Z981 Arthrodesis status: Secondary | ICD-10-CM | POA: Diagnosis not present

## 2020-07-01 DIAGNOSIS — D693 Immune thrombocytopenic purpura: Secondary | ICD-10-CM | POA: Diagnosis not present

## 2020-07-01 DIAGNOSIS — D500 Iron deficiency anemia secondary to blood loss (chronic): Secondary | ICD-10-CM | POA: Diagnosis not present

## 2020-07-04 DIAGNOSIS — U071 COVID-19: Secondary | ICD-10-CM | POA: Diagnosis not present

## 2020-07-09 DIAGNOSIS — Z760 Encounter for issue of repeat prescription: Secondary | ICD-10-CM | POA: Diagnosis not present

## 2020-07-09 DIAGNOSIS — Z79899 Other long term (current) drug therapy: Secondary | ICD-10-CM | POA: Diagnosis not present

## 2020-07-09 DIAGNOSIS — Z8781 Personal history of (healed) traumatic fracture: Secondary | ICD-10-CM | POA: Diagnosis not present

## 2020-07-09 DIAGNOSIS — M19071 Primary osteoarthritis, right ankle and foot: Secondary | ICD-10-CM | POA: Diagnosis not present

## 2020-07-09 DIAGNOSIS — D696 Thrombocytopenia, unspecified: Secondary | ICD-10-CM | POA: Diagnosis not present

## 2020-07-09 DIAGNOSIS — R400 Somnolence: Secondary | ICD-10-CM | POA: Diagnosis not present

## 2020-07-09 DIAGNOSIS — M545 Low back pain, unspecified: Secondary | ICD-10-CM | POA: Diagnosis not present

## 2020-07-09 DIAGNOSIS — M79604 Pain in right leg: Secondary | ICD-10-CM | POA: Diagnosis not present

## 2020-07-09 DIAGNOSIS — Z87891 Personal history of nicotine dependence: Secondary | ICD-10-CM | POA: Diagnosis not present

## 2020-07-09 DIAGNOSIS — G8918 Other acute postprocedural pain: Secondary | ICD-10-CM | POA: Diagnosis not present

## 2020-07-10 ENCOUNTER — Other Ambulatory Visit: Payer: Self-pay | Admitting: Psychiatry

## 2020-07-10 DIAGNOSIS — F4310 Post-traumatic stress disorder, unspecified: Secondary | ICD-10-CM

## 2020-07-10 DIAGNOSIS — F411 Generalized anxiety disorder: Secondary | ICD-10-CM

## 2020-07-10 DIAGNOSIS — F330 Major depressive disorder, recurrent, mild: Secondary | ICD-10-CM

## 2020-07-16 DIAGNOSIS — D500 Iron deficiency anemia secondary to blood loss (chronic): Secondary | ICD-10-CM | POA: Diagnosis not present

## 2020-07-16 DIAGNOSIS — D693 Immune thrombocytopenic purpura: Secondary | ICD-10-CM | POA: Diagnosis not present

## 2020-07-18 DIAGNOSIS — M19071 Primary osteoarthritis, right ankle and foot: Secondary | ICD-10-CM | POA: Diagnosis not present

## 2020-07-18 DIAGNOSIS — M19171 Post-traumatic osteoarthritis, right ankle and foot: Secondary | ICD-10-CM | POA: Diagnosis not present

## 2020-07-22 DIAGNOSIS — D500 Iron deficiency anemia secondary to blood loss (chronic): Secondary | ICD-10-CM | POA: Diagnosis not present

## 2020-07-22 DIAGNOSIS — R569 Unspecified convulsions: Secondary | ICD-10-CM | POA: Diagnosis not present

## 2020-07-22 DIAGNOSIS — D693 Immune thrombocytopenic purpura: Secondary | ICD-10-CM | POA: Diagnosis not present

## 2020-07-23 DIAGNOSIS — G8918 Other acute postprocedural pain: Secondary | ICD-10-CM | POA: Diagnosis not present

## 2020-07-30 DIAGNOSIS — D500 Iron deficiency anemia secondary to blood loss (chronic): Secondary | ICD-10-CM | POA: Diagnosis not present

## 2020-07-30 DIAGNOSIS — D693 Immune thrombocytopenic purpura: Secondary | ICD-10-CM | POA: Diagnosis not present

## 2020-08-06 DIAGNOSIS — G8929 Other chronic pain: Secondary | ICD-10-CM | POA: Diagnosis not present

## 2020-08-06 DIAGNOSIS — G894 Chronic pain syndrome: Secondary | ICD-10-CM | POA: Diagnosis not present

## 2020-08-06 DIAGNOSIS — G8918 Other acute postprocedural pain: Secondary | ICD-10-CM | POA: Diagnosis not present

## 2020-08-06 DIAGNOSIS — M25571 Pain in right ankle and joints of right foot: Secondary | ICD-10-CM | POA: Diagnosis not present

## 2020-08-15 DIAGNOSIS — E559 Vitamin D deficiency, unspecified: Secondary | ICD-10-CM | POA: Diagnosis not present

## 2020-08-15 DIAGNOSIS — M19071 Primary osteoarthritis, right ankle and foot: Secondary | ICD-10-CM | POA: Diagnosis not present

## 2020-08-19 DIAGNOSIS — G894 Chronic pain syndrome: Secondary | ICD-10-CM | POA: Diagnosis not present

## 2020-08-19 DIAGNOSIS — G8918 Other acute postprocedural pain: Secondary | ICD-10-CM | POA: Diagnosis not present

## 2020-08-19 DIAGNOSIS — Z79891 Long term (current) use of opiate analgesic: Secondary | ICD-10-CM | POA: Diagnosis not present

## 2020-08-19 DIAGNOSIS — M25571 Pain in right ankle and joints of right foot: Secondary | ICD-10-CM | POA: Diagnosis not present

## 2020-08-19 DIAGNOSIS — G8929 Other chronic pain: Secondary | ICD-10-CM | POA: Diagnosis not present

## 2020-08-19 DIAGNOSIS — Z5181 Encounter for therapeutic drug level monitoring: Secondary | ICD-10-CM | POA: Diagnosis not present

## 2020-08-19 DIAGNOSIS — M2550 Pain in unspecified joint: Secondary | ICD-10-CM | POA: Diagnosis not present

## 2020-08-20 DIAGNOSIS — D500 Iron deficiency anemia secondary to blood loss (chronic): Secondary | ICD-10-CM | POA: Diagnosis not present

## 2020-08-20 DIAGNOSIS — D693 Immune thrombocytopenic purpura: Secondary | ICD-10-CM | POA: Diagnosis not present

## 2020-08-25 ENCOUNTER — Encounter (HOSPITAL_COMMUNITY): Payer: Self-pay

## 2020-08-25 ENCOUNTER — Other Ambulatory Visit: Payer: Self-pay

## 2020-08-25 ENCOUNTER — Emergency Department (HOSPITAL_COMMUNITY): Payer: Medicare Other

## 2020-08-25 ENCOUNTER — Inpatient Hospital Stay (HOSPITAL_COMMUNITY)
Admission: EM | Admit: 2020-08-25 | Discharge: 2020-08-27 | DRG: 917 | Disposition: A | Discharge status: Home / Self Care | Payer: Medicare Other | Attending: Internal Medicine | Admitting: Internal Medicine

## 2020-08-25 ENCOUNTER — Observation Stay (HOSPITAL_COMMUNITY): Payer: Medicare Other

## 2020-08-25 DIAGNOSIS — F4310 Post-traumatic stress disorder, unspecified: Secondary | ICD-10-CM | POA: Diagnosis present

## 2020-08-25 DIAGNOSIS — G928 Other toxic encephalopathy: Secondary | ICD-10-CM | POA: Diagnosis not present

## 2020-08-25 DIAGNOSIS — T510X1A Toxic effect of ethanol, accidental (unintentional), initial encounter: Principal | ICD-10-CM | POA: Diagnosis present

## 2020-08-25 DIAGNOSIS — F419 Anxiety disorder, unspecified: Secondary | ICD-10-CM | POA: Diagnosis present

## 2020-08-25 DIAGNOSIS — E669 Obesity, unspecified: Secondary | ICD-10-CM | POA: Diagnosis present

## 2020-08-25 DIAGNOSIS — G8929 Other chronic pain: Secondary | ICD-10-CM | POA: Diagnosis present

## 2020-08-25 DIAGNOSIS — Z9049 Acquired absence of other specified parts of digestive tract: Secondary | ICD-10-CM | POA: Diagnosis not present

## 2020-08-25 DIAGNOSIS — D72829 Elevated white blood cell count, unspecified: Secondary | ICD-10-CM | POA: Diagnosis not present

## 2020-08-25 DIAGNOSIS — E871 Hypo-osmolality and hyponatremia: Secondary | ICD-10-CM | POA: Diagnosis present

## 2020-08-25 DIAGNOSIS — Z9884 Bariatric surgery status: Secondary | ICD-10-CM

## 2020-08-25 DIAGNOSIS — F10929 Alcohol use, unspecified with intoxication, unspecified: Secondary | ICD-10-CM | POA: Diagnosis present

## 2020-08-25 DIAGNOSIS — Y908 Blood alcohol level of 240 mg/100 ml or more: Secondary | ICD-10-CM | POA: Diagnosis present

## 2020-08-25 DIAGNOSIS — I10 Essential (primary) hypertension: Secondary | ICD-10-CM | POA: Diagnosis not present

## 2020-08-25 DIAGNOSIS — G40909 Epilepsy, unspecified, not intractable, without status epilepticus: Secondary | ICD-10-CM | POA: Diagnosis present

## 2020-08-25 DIAGNOSIS — F10229 Alcohol dependence with intoxication, unspecified: Secondary | ICD-10-CM | POA: Diagnosis present

## 2020-08-25 DIAGNOSIS — Z87891 Personal history of nicotine dependence: Secondary | ICD-10-CM

## 2020-08-25 DIAGNOSIS — Z9151 Personal history of suicidal behavior: Secondary | ICD-10-CM

## 2020-08-25 DIAGNOSIS — S0990XA Unspecified injury of head, initial encounter: Secondary | ICD-10-CM | POA: Diagnosis not present

## 2020-08-25 DIAGNOSIS — R197 Diarrhea, unspecified: Secondary | ICD-10-CM | POA: Diagnosis present

## 2020-08-25 DIAGNOSIS — I499 Cardiac arrhythmia, unspecified: Secondary | ICD-10-CM | POA: Diagnosis not present

## 2020-08-25 DIAGNOSIS — R4182 Altered mental status, unspecified: Secondary | ICD-10-CM | POA: Diagnosis not present

## 2020-08-25 DIAGNOSIS — N200 Calculus of kidney: Secondary | ICD-10-CM | POA: Diagnosis not present

## 2020-08-25 DIAGNOSIS — Z862 Personal history of diseases of the blood and blood-forming organs and certain disorders involving the immune mechanism: Secondary | ICD-10-CM | POA: Diagnosis not present

## 2020-08-25 DIAGNOSIS — R404 Transient alteration of awareness: Secondary | ICD-10-CM | POA: Diagnosis not present

## 2020-08-25 DIAGNOSIS — E860 Dehydration: Secondary | ICD-10-CM | POA: Diagnosis present

## 2020-08-25 DIAGNOSIS — R000 Tachycardia, unspecified: Secondary | ICD-10-CM | POA: Diagnosis present

## 2020-08-25 DIAGNOSIS — E538 Deficiency of other specified B group vitamins: Secondary | ICD-10-CM | POA: Diagnosis not present

## 2020-08-25 DIAGNOSIS — T50904A Poisoning by unspecified drugs, medicaments and biological substances, undetermined, initial encounter: Secondary | ICD-10-CM | POA: Diagnosis not present

## 2020-08-25 DIAGNOSIS — F1021 Alcohol dependence, in remission: Secondary | ICD-10-CM

## 2020-08-25 DIAGNOSIS — Z808 Family history of malignant neoplasm of other organs or systems: Secondary | ICD-10-CM

## 2020-08-25 DIAGNOSIS — Z20822 Contact with and (suspected) exposure to covid-19: Secondary | ICD-10-CM | POA: Diagnosis present

## 2020-08-25 DIAGNOSIS — R6510 Systemic inflammatory response syndrome (SIRS) of non-infectious origin without acute organ dysfunction: Secondary | ICD-10-CM | POA: Diagnosis not present

## 2020-08-25 DIAGNOSIS — D693 Immune thrombocytopenic purpura: Secondary | ICD-10-CM | POA: Diagnosis not present

## 2020-08-25 DIAGNOSIS — E872 Acidosis, unspecified: Secondary | ICD-10-CM

## 2020-08-25 DIAGNOSIS — Z6836 Body mass index (BMI) 36.0-36.9, adult: Secondary | ICD-10-CM

## 2020-08-25 DIAGNOSIS — F1190 Opioid use, unspecified, uncomplicated: Secondary | ICD-10-CM | POA: Diagnosis present

## 2020-08-25 DIAGNOSIS — Z743 Need for continuous supervision: Secondary | ICD-10-CM | POA: Diagnosis not present

## 2020-08-25 DIAGNOSIS — D689 Coagulation defect, unspecified: Secondary | ICD-10-CM | POA: Diagnosis not present

## 2020-08-25 DIAGNOSIS — G894 Chronic pain syndrome: Secondary | ICD-10-CM | POA: Diagnosis not present

## 2020-08-25 DIAGNOSIS — F339 Major depressive disorder, recurrent, unspecified: Secondary | ICD-10-CM | POA: Diagnosis present

## 2020-08-25 DIAGNOSIS — R0689 Other abnormalities of breathing: Secondary | ICD-10-CM | POA: Diagnosis not present

## 2020-08-25 DIAGNOSIS — Z833 Family history of diabetes mellitus: Secondary | ICD-10-CM

## 2020-08-25 DIAGNOSIS — E876 Hypokalemia: Secondary | ICD-10-CM | POA: Diagnosis not present

## 2020-08-25 DIAGNOSIS — Z8249 Family history of ischemic heart disease and other diseases of the circulatory system: Secondary | ICD-10-CM

## 2020-08-25 LAB — BLOOD GAS, VENOUS
Acid-base deficit: 6.1 mmol/L — ABNORMAL HIGH (ref 0.0–2.0)
FIO2: 21
Patient temperature: 37.1
pCO2, Ven: 35.2 mmHg — ABNORMAL LOW (ref 44.0–60.0)
pH, Ven: 7.343 (ref 7.250–7.430)
pO2, Ven: 36.9 mmHg (ref 32.0–45.0)

## 2020-08-25 LAB — BASIC METABOLIC PANEL
Anion gap: 14 (ref 5–15)
BUN: 5 mg/dL — ABNORMAL LOW (ref 6–20)
CO2: 21 mmol/L — ABNORMAL LOW (ref 22–32)
Calcium: 7.6 mg/dL — ABNORMAL LOW (ref 8.9–10.3)
Chloride: 94 mmol/L — ABNORMAL LOW (ref 98–111)
Creatinine, Ser: 0.56 mg/dL (ref 0.44–1.00)
GFR, Estimated: >60 mL/min (ref >60)
Glucose, Bld: 98 mg/dL (ref 70–99)
Potassium: 3.8 mmol/L (ref 3.5–5.1)
Sodium: 129 mmol/L — ABNORMAL LOW (ref 135–145)

## 2020-08-25 LAB — C DIFFICILE QUICK SCREEN W PCR REFLEX
C Diff antigen: NEGATIVE
C Diff interpretation: NOT DETECTED
C Diff toxin: NEGATIVE

## 2020-08-25 LAB — CK TOTAL AND CKMB (NOT AT ARMC)
CK, MB: 0.5 ng/mL (ref 0.5–5.0)
Relative Index: INVALID (ref 0.0–2.5)
Total CK: 18 U/L — ABNORMAL LOW (ref 38–234)

## 2020-08-25 LAB — CBC
HCT: 52.2 % — ABNORMAL HIGH (ref 36.0–46.0)
Hemoglobin: 17.2 g/dL — ABNORMAL HIGH (ref 12.0–15.0)
MCH: 33.5 pg (ref 26.0–34.0)
MCHC: 33 g/dL (ref 30.0–36.0)
MCV: 101.8 fL — ABNORMAL HIGH (ref 80.0–100.0)
Platelets: 255 10*3/uL (ref 150–400)
RBC: 5.13 MIL/uL — ABNORMAL HIGH (ref 3.87–5.11)
RDW: 14.6 % (ref 11.5–15.5)
WBC: 14.9 10*3/uL — ABNORMAL HIGH (ref 4.0–10.5)
nRBC: 0 % (ref 0.0–0.2)

## 2020-08-25 LAB — HCG, SERUM, QUALITATIVE: Preg, Serum: NEGATIVE

## 2020-08-25 LAB — LACTIC ACID, PLASMA
Lactic Acid, Venous: 4.8 mmol/L (ref 0.5–1.9)
Lactic Acid, Venous: 4.8 mmol/L (ref 0.5–1.9)
Lactic Acid, Venous: 5.8 mmol/L (ref 0.5–1.9)

## 2020-08-25 LAB — TYPE AND SCREEN
ABO/RH(D): O POS
Antibody Screen: NEGATIVE

## 2020-08-25 LAB — PHOSPHORUS: Phosphorus: 1.9 mg/dL — ABNORMAL LOW (ref 2.5–4.6)

## 2020-08-25 LAB — RESP PANEL BY RT-PCR (FLU A&B, COVID) ARPGX2
Influenza A by PCR: NEGATIVE
Influenza B by PCR: NEGATIVE
SARS Coronavirus 2 by RT PCR: NEGATIVE

## 2020-08-25 LAB — RAPID URINE DRUG SCREEN, HOSP PERFORMED
Amphetamines: NOT DETECTED
Barbiturates: NOT DETECTED
Benzodiazepines: NOT DETECTED
Cocaine: NOT DETECTED
Opiates: NOT DETECTED
Tetrahydrocannabinol: NOT DETECTED

## 2020-08-25 LAB — ETHANOL: Alcohol, Ethyl (B): 323 mg/dL (ref <10)

## 2020-08-25 LAB — MAGNESIUM: Magnesium: 1.7 mg/dL (ref 1.7–2.4)

## 2020-08-25 MED ORDER — SODIUM CHLORIDE 0.9 % IV BOLUS
500.0000 mL | Freq: Once | INTRAVENOUS | Status: AC
  Administered 2020-08-25: 500 mL via INTRAVENOUS

## 2020-08-25 MED ORDER — THIAMINE HCL 100 MG PO TABS
100.0000 mg | ORAL_TABLET | Freq: Every day | ORAL | Status: DC
  Administered 2020-08-26 – 2020-08-27 (×2): 100 mg via ORAL
  Filled 2020-08-25 (×2): qty 1

## 2020-08-25 MED ORDER — ACETAMINOPHEN 650 MG RE SUPP: 650.0000 mg | Freq: Four times a day (QID) | RECTAL | Status: DC | PRN

## 2020-08-25 MED ORDER — METRONIDAZOLE IN NACL 5-0.79 MG/ML-% IV SOLN
500.0000 mg | Freq: Once | INTRAVENOUS | Status: AC
  Administered 2020-08-26: 500 mg via INTRAVENOUS
  Filled 2020-08-25: qty 100

## 2020-08-25 MED ORDER — VANCOMYCIN HCL IN DEXTROSE 1-5 GM/200ML-% IV SOLN
1000.0000 mg | Freq: Three times a day (TID) | INTRAVENOUS | Status: DC
  Administered 2020-08-26: 1000 mg via INTRAVENOUS
  Filled 2020-08-25: qty 200

## 2020-08-25 MED ORDER — LACTATED RINGERS IV SOLN: INTRAVENOUS | Status: DC

## 2020-08-25 MED ORDER — ENOXAPARIN SODIUM 40 MG/0.4ML ~~LOC~~ SOLN
40.0000 mg | SUBCUTANEOUS | Status: DC
  Filled 2020-08-25: qty 0.4

## 2020-08-25 MED ORDER — POLYETHYLENE GLYCOL 3350 17 G PO PACK: 17.0000 g | PACK | Freq: Every day | ORAL | Status: DC | PRN

## 2020-08-25 MED ORDER — LORAZEPAM 1 MG PO TABS
1.0000 mg | ORAL_TABLET | ORAL | Status: DC | PRN
  Administered 2020-08-25 – 2020-08-26 (×5): 1 mg via ORAL
  Administered 2020-08-27 (×3): 2 mg via ORAL
  Filled 2020-08-25: qty 2
  Filled 2020-08-25 (×4): qty 1
  Filled 2020-08-25 (×2): qty 2
  Filled 2020-08-25: qty 1

## 2020-08-25 MED ORDER — LACTATED RINGERS IV BOLUS (SEPSIS)
600.0000 mL | Freq: Once | INTRAVENOUS | Status: AC
  Administered 2020-08-25: 600 mL via INTRAVENOUS

## 2020-08-25 MED ORDER — LORAZEPAM 1 MG PO TABS: 0.0000 mg | ORAL_TABLET | Freq: Four times a day (QID) | ORAL | Status: DC

## 2020-08-25 MED ORDER — IOHEXOL 350 MG/ML SOLN
100.0000 mL | Freq: Once | INTRAVENOUS | Status: AC | PRN
  Administered 2020-08-25: 100 mL via INTRAVENOUS

## 2020-08-25 MED ORDER — FOLIC ACID 1 MG PO TABS
1.0000 mg | ORAL_TABLET | Freq: Every day | ORAL | Status: DC
  Administered 2020-08-26 – 2020-08-27 (×2): 1 mg via ORAL
  Filled 2020-08-25 (×2): qty 1

## 2020-08-25 MED ORDER — ONDANSETRON HCL 4 MG/2ML IJ SOLN
4.0000 mg | Freq: Four times a day (QID) | INTRAMUSCULAR | Status: DC | PRN
  Administered 2020-08-26: 4 mg via INTRAVENOUS
  Filled 2020-08-25: qty 2

## 2020-08-25 MED ORDER — KCL IN DEXTROSE-NACL 20-5-0.9 MEQ/L-%-% IV SOLN: INTRAVENOUS | Status: AC

## 2020-08-25 MED ORDER — K PHOS MONO-SOD PHOS DI & MONO 155-852-130 MG PO TABS
250.0000 mg | ORAL_TABLET | Freq: Three times a day (TID) | ORAL | Status: AC
  Administered 2020-08-25 – 2020-08-26 (×3): 250 mg via ORAL
  Filled 2020-08-25 (×3): qty 1

## 2020-08-25 MED ORDER — ONDANSETRON HCL 4 MG PO TABS: 4.0000 mg | ORAL_TABLET | Freq: Four times a day (QID) | ORAL | Status: DC | PRN

## 2020-08-25 MED ORDER — LORAZEPAM 2 MG/ML IJ SOLN: 0.0000 mg | Freq: Two times a day (BID) | INTRAMUSCULAR | Status: DC

## 2020-08-25 MED ORDER — LORAZEPAM 1 MG PO TABS: 0.0000 mg | ORAL_TABLET | Freq: Two times a day (BID) | ORAL | Status: DC

## 2020-08-25 MED ORDER — SODIUM CHLORIDE 0.9 % IV SOLN: 2.0000 g | Freq: Three times a day (TID) | INTRAVENOUS | Status: DC

## 2020-08-25 MED ORDER — SODIUM CHLORIDE 0.9 % IV SOLN
2.0000 g | Freq: Once | INTRAVENOUS | Status: AC
  Administered 2020-08-25: 2 g via INTRAVENOUS
  Filled 2020-08-25: qty 2

## 2020-08-25 MED ORDER — LORAZEPAM 2 MG/ML IJ SOLN: 0.0000 mg | Freq: Four times a day (QID) | INTRAMUSCULAR | Status: DC

## 2020-08-25 MED ORDER — LORAZEPAM 2 MG/ML IJ SOLN: 1.0000 mg | INTRAMUSCULAR | Status: DC | PRN

## 2020-08-25 MED ORDER — SODIUM CHLORIDE 0.9 % IV SOLN
2.0000 g | Freq: Three times a day (TID) | INTRAVENOUS | Status: DC
  Administered 2020-08-25: 2 g via INTRAVENOUS
  Filled 2020-08-25: qty 2

## 2020-08-25 MED ORDER — METRONIDAZOLE IN NACL 5-0.79 MG/ML-% IV SOLN
500.0000 mg | Freq: Once | INTRAVENOUS | Status: AC
  Administered 2020-08-25: 500 mg via INTRAVENOUS
  Filled 2020-08-25: qty 100

## 2020-08-25 MED ORDER — LORAZEPAM 2 MG/ML IJ SOLN
0.0000 mg | Freq: Four times a day (QID) | INTRAMUSCULAR | Status: DC
  Administered 2020-08-26 – 2020-08-27 (×3): 2 mg via INTRAVENOUS
  Filled 2020-08-25 (×3): qty 1

## 2020-08-25 MED ORDER — THIAMINE HCL 100 MG PO TABS: 100.0000 mg | ORAL_TABLET | Freq: Every day | ORAL | Status: DC

## 2020-08-25 MED ORDER — THIAMINE HCL 100 MG/ML IJ SOLN
100.0000 mg | Freq: Every day | INTRAMUSCULAR | Status: DC
  Administered 2020-08-25: 100 mg via INTRAVENOUS
  Filled 2020-08-25: qty 2

## 2020-08-25 MED ORDER — VANCOMYCIN HCL IN DEXTROSE 1-5 GM/200ML-% IV SOLN: 1000.0000 mg | Freq: Three times a day (TID) | INTRAVENOUS | Status: DC

## 2020-08-25 MED ORDER — THIAMINE HCL 100 MG/ML IJ SOLN: 100.0000 mg | Freq: Every day | INTRAMUSCULAR | Status: DC

## 2020-08-25 MED ORDER — LACTATED RINGERS IV BOLUS (SEPSIS)
1000.0000 mL | Freq: Once | INTRAVENOUS | Status: AC
  Administered 2020-08-25: 1000 mL via INTRAVENOUS

## 2020-08-25 MED ORDER — VANCOMYCIN HCL IN DEXTROSE 1-5 GM/200ML-% IV SOLN: 1000.0000 mg | Freq: Once | INTRAVENOUS | Status: DC

## 2020-08-25 MED ORDER — VANCOMYCIN HCL 1750 MG/350ML IV SOLN
1750.0000 mg | Freq: Once | INTRAVENOUS | Status: AC
  Administered 2020-08-25: 1750 mg via INTRAVENOUS
  Filled 2020-08-25: qty 350

## 2020-08-25 MED ORDER — ACETAMINOPHEN 325 MG PO TABS
650.0000 mg | ORAL_TABLET | Freq: Four times a day (QID) | ORAL | Status: DC | PRN
  Administered 2020-08-25 – 2020-08-27 (×7): 650 mg via ORAL
  Filled 2020-08-25 (×7): qty 2

## 2020-08-25 MED ORDER — TOPIRAMATE 100 MG PO TABS
200.0000 mg | ORAL_TABLET | Freq: Two times a day (BID) | ORAL | Status: DC
  Administered 2020-08-25 – 2020-08-27 (×4): 200 mg via ORAL
  Filled 2020-08-25 (×4): qty 2

## 2020-08-25 NOTE — H&P (Addendum)
History and Physical    Leslie [name] BMW:[id number] DOB: 01/03/1976 DOA: 08/25/2020  PCP: Hortencia Pilar, MD   Patient coming from: Home  I have personally briefly reviewed patient's old medical records in [place]  Chief Complaint: AMS  HPI: Leslie [name] is a 45 y.o. female with medical history significant for idiopathic thrombocytopenic purpura, alcohol abuse in remission, seizures, chronic pain on chronic opiates, anxiety, depression, suicide attempt. Patient was brought to the ED via EMS reports of altered mental status.  Patient was housesitting, the homeowners arrived at about 1:30 PM last night and found patient on the floor, and she had stool on her.  On arrival to the ED patient's mental status gradually improved becoming more awake alert. On my evaluation mental status is improving, she is able to answer questions , she reports she drank a lot of alcohol last night, she is unable to quantify at this time.  She reports that before this she had not drank any alcohol in 4 years.  She denies any triggers or recent stressors prompting her to binge drink last night. She reports onset of watery stools today.  No vomiting.  No abdominal pain.  Reports no seizures in several years.  ED Course: Temperature 97.9.  Rectal 141.  Respiratory rate 19-34.  Blood pressure systolic 272Z to 366Y.  O2 sats greater than 96% on room air.  Blood alcohol level 323, lactic acid 4.8 x 2 , after 1 L bolus of fluids.  WBC 14.3.  Sodium 129.  With concern for sepsis, full sepsis fluids given in ED.  Started on broad-spectrum antibiotics IV Vanco cefepime and metronidazole.  Review of Systems: As per HPI all other systems reviewed and negative.  Past Medical History:  Diagnosis Date  . Anemia   . Anxiety   . Epilepsy ([place])   . ITP (idiopathic thrombocytopenic purpura)     Past Surgical History:  Procedure Laterality Date  . ANKLE ARTHROSCOPY    . CHOLECYSTECTOMY    . GASTRIC BYPASS  2016   . KNEE ARTHROSCOPY    . OPEN REDUCTION PROXIMAL HUMERUS FRACTURE  Left 3/218  . SPLENECTOMY, TOTAL  2006   for ITP  . TONSILLECTOMY    . TUBAL LIGATION  2009     reports that she quit smoking about 7 years ago. Her smoking use included cigarettes. She has never used smokeless tobacco. She reports current alcohol use. She reports that she does not use drugs.  Allergies  Allergen Reactions  . Sodium Ferric Gluconate [Ferrous Gluconate] Anaphylaxis  . Ace Inhibitors Cough  . Paroxetine Other (See Comments)    H/O ITP, bleeding risk  . Rituximab Other (See Comments)    Patient developed seizures; this happened previously with rituximab in 2014  . Bupropion Other (See Comments) and Rash    H/o seizures Contraindicated due to seizures H/o seizures  . Venlafaxine Rash    Didn't work Didn't work Didn't work    Family History  Problem Relation Age of Onset  . Diabetes Father   . Hypertension Father   . Brain cancer Father        GBM  . Hypertension Mother   . Drug abuse Sister     Prior to Admission medications   Medication Sig Start Date End Date Taking? Authorizing Provider  acetaminophen (TYLENOL) 325 MG tablet Take by mouth.    [provider]  BELBUCA 900 MCG FILM Take 1 strip by mouth 2 (two) times daily. 08/12/19  [provider]  Buprenorphine HCl (BELBUCA) 900 MCG FILM Place inside cheek. 09/08/19   [provider]  cloBAZam (ONFI) 10 MG tablet Take by mouth.    [provider]  cloBAZam (ONFI) 10 MG tablet  03/16/19   [provider]  DEXAMETHASONE PO Take 40 mg by mouth daily.    [provider]  eltrombopag (PROMACTA) 50 MG tablet TAKE 2 TABLETS PO ONCE DAILY FOR 30 DAYS. TAKE ON AN EMPTY STOMACH 1 HOUR BEFORE OR 2 HOURS AFTER A MEAL 04/27/19   [provider]  EPINEPHrine 0.3 mg/0.3 mL IJ SOAJ injection Inject into the muscle.    [provider]  fostamatinib disodium (TAVALISSE) 100 MG tablet  Take by mouth.    [provider]  gabapentin (NEURONTIN) 300 MG capsule  03/10/19   [provider]  gabapentin (NEURONTIN) 300 MG capsule Take by mouth. 09/04/19   [provider]  hydrochlorothiazide (HYDRODIURIL) 25 MG tablet Take 25 mg by mouth daily. 05/10/19   [provider]  hydrochlorothiazide (HYDRODIURIL) 25 MG tablet Take by mouth. 05/10/19 05/09/20  [provider]  HYDROcodone-acetaminophen [facility]) 10-325 MG tablet  01/03/19   [provider]  HYDROcodone-acetaminophen (NORCO) 7.5-325 MG tablet  10/13/18   [provider]  hydrocortisone-pramoxine [facility]) 2.5-1 % rectal cream Three times daily as needed. 03/07/19   [provider]  morphine (MSIR) 15 MG tablet  10/25/18   [provider]  morphine (MSIR) 15 MG tablet Take 1 tablet every 4 hours as needed. Maximum 5 per day 09/10/19   [provider]  morphine (MSIR) 30 MG tablet  03/16/19   [provider]  mycophenolate (CELLCEPT) 500 MG tablet Take 2,000 mg by mouth 2 (two) times daily.     [provider]  naloxone [facility]) nasal spray 4 mg/0.1 mL One spray in nostril if patient is not breathing; call 911; if needed repeat after 2 minutes 04/06/18   [provider]  naloxone [facility]) nasal spray 4 mg/0.1 mL One spray in nostril if patient is not breathing; call 911; if needed repeat after 2 minutes 09/04/19   [provider]  NAYZILAM 5 MG/0.1ML SOLN  08/08/19   [provider]  ondansetron (ZOFRAN-ODT) 4 MG disintegrating tablet  05/15/19   [provider]  oxyCODONE (OXY IR/ROXICODONE) 5 MG immediate release tablet  06/06/19   [provider]  polyethylene glycol (MIRALAX / GLYCOLAX) 17 g packet Take by mouth.    [provider]  potassium chloride (KLOR-CON) 20 MEQ packet Take 20 mEq by mouth daily.    [provider]  Pramoxine-HC (HYDROCORTISONE ACE-PRAMOXINE)  2.5-1 % CREA Three times daily as needed. 03/07/19   [provider]  predniSONE (DELTASONE) 20 MG tablet  04/24/19   [provider]  predniSONE (DELTASONE) 20 MG tablet  04/24/19   [provider]  PROMACTA 50 MG tablet Take 100 mg by mouth daily. 04/27/19   [provider]  QUEtiapine (SEROQUEL) 25 MG tablet TAKE 1 TABLET AT BEDTIME AND 1/2 TABLET AS NEEDED DAILY FOR SEVERE ANXIETY/AGITATION. 01/03/20   Darcel Smalling, MD  tamsulosin (FLOMAX) 0.4 MG CAPS capsule Take 0.4 mg by mouth daily. 05/15/19   [provider]  TAVALISSE 100 MG tablet  05/12/19   [provider]  topiramate (TOPAMAX) 200 MG tablet Take 200 mg by mouth 2 (two) times daily.    [provider]  topiramate (TOPAMAX) 200 MG tablet Take by mouth.  09/01/19   [provider]  TRINTELLIX 10 MG TABS tablet TAKE 1 AND 1/2 TABLETS BY MOUTH EVERY DAY 06/10/20   Ursula Alert, MD  vitamin B-12 (CYANOCOBALAMIN) 1000 MCG tablet Take by mouth. 12/30/17   [provider]    Physical Exam: Vitals:   08/25/20 1730 08/25/20 1800 08/25/20 1923 08/25/20 1930  BP: (!) 134/95 135/90  123/75  Pulse: (!) 124 (!) 132 (!) 118 (!) 111  Resp: (!) 25 (!) 22  20  Temp:    97.9 F (36.6 C)  TempSrc:    Oral  SpO2: 100% 99% 98% 99%  Weight:      Height:        Constitutional: NAD, calm, comfortable Vitals:   08/25/20 1730 08/25/20 1800 08/25/20 1923 08/25/20 1930  BP: (!) 134/95 135/90  123/75  Pulse: (!) 124 (!) 132 (!) 118 (!) 111  Resp: (!) 25 (!) 22  20  Temp:    97.9 F (36.6 C)  TempSrc:    Oral  SpO2: 100% 99% 98% 99%  Weight:      Height:       Eyes: PERRL, lids and conjunctivae normal ENMT: Mucous membranes are moist.  Neck: normal, supple, no masses, no thyromegaly Respiratory: clear to auscultation bilaterally, no wheezing, no crackles. Normal respiratory effort. No accessory muscle use.  Cardiovascular: Regular rate and rhythm, no murmurs /  rubs / gallops. No extremity edema. 2+ pedal pulses.  Abdomen:  no tenderness, no masses palpated. No hepatosplenomegaly. Bowel sounds positive.  Musculoskeletal: no clubbing / cyanosis. No joint deformity upper and lower extremities. Good ROM, no contractures.  Rigid cast to right lower extremity-per patient had recent ankle surgery at [facility]. Skin: no rashes, lesions, ulcers. No induration Neurologic: Moving all extremities spontaneously at least 4/5 strength. Psychiatric: Normal judgment and insight. Alert and oriented x 4. Normal mood.   Labs on Admission: I have personally reviewed following labs and imaging studies  CBC: Recent Labs  Lab 08/25/20 1242  WBC 14.9*  HGB 17.2*  HCT 52.2*  MCV 101.8*  PLT 416   Basic Metabolic Panel: Recent Labs  Lab 08/25/20 1242  NA 129*  K 3.8  CL 94*  CO2 21*  GLUCOSE 98  BUN 5*  CREATININE 0.56  CALCIUM 7.6*    Radiological Exams on Admission: CT Head Wo Contrast  Result Date: 08/25/2020 CLINICAL DATA:  Head trauma.  Coagulopathy.  Pain. EXAM: CT HEAD WITHOUT CONTRAST TECHNIQUE: Contiguous axial images were obtained from the base of the skull through the vertex without intravenous contrast. COMPARISON:  None. FINDINGS: Brain: No evidence of acute infarction, hemorrhage, hydrocephalus, extra-axial collection or mass lesion/mass effect. Vascular: No hyperdense vessel or unexpected calcification. Skull: Normal. Negative for fracture or focal lesion. Sinuses/Orbits: No acute finding. Other: None. IMPRESSION: No acute intracranial abnormalities identified. Electronically Signed   By: Dorise Bullion III M.D   On: 08/25/2020 13:29   DG Chest Port 1 View  Result Date: 08/25/2020 CLINICAL DATA:  Acute mental status change. EXAM: PORTABLE CHEST 1 VIEW COMPARISON:  August 13, 2003 FINDINGS: A right Port-A-Cath terminates in good position. The heart, hila, mediastinum, lungs, and pleura are normal. No acute abnormalities. IMPRESSION: No active  disease. Electronically Signed   By: Dorise Bullion III M.D   On: 08/25/2020 13:30    EKG: Independently reviewed.  Sinus tachycardia rate 139.  QTc 459.  No significant change from prior.  Assessment/Plan Principal Problem:   Alcohol intoxication ([place]) Active Problems:  Hyponatremia   Diarrhea   SIRS (systemic inflammatory response syndrome) (HCC)   History of ITP   Seizure disorder ([facility])   Recurrent major depressive disorder ([facility])   PTSD (post-traumatic stress disorder)   HTN (hypertension)   Chronic pain   Alcohol dependence in remission ([employer])   History of attempted suicide   Chronic, continuous use of opioids   Alcohol intoxication with history of alcohol dependence in remission-blood alcohol level 323, from sudden binge drinking last night.  Otherwise she reports she not drunk any alcohol in 4 years. Her SIRs presentation may also be due to alcohol intoxication, but need to rule out concomitant infection. -CIWA protocol as needed and scheduled -IV fluids -Magnesium normal, phosphorus low 1.9, replete orally  SIRS -with tachycardia to 143, leukocytosis, lactic acidosis 4.8 > 5.8 despite fluid bolus.  Afebrile. Patient with diarrhea but denies other symptoms, benign abdominal exam.  Likely due to alcohol intoxication, but need to rule out concomitant infection. -  Abdominal pelvic CTA rule out ischemia or colitis. - Stool C. difficile result-negative. -Will continue broad-spectrum antibiotics for now, low threshold to discontinue - 3.2  L bolus given, continue D5 N/s + 20 KCL 100cc/hr  - Trend lactic acid. -Follow-up blood cultures  Toxic encephalopathy likely 2/2 alcohol intoxication. Head CT without Acute abnormality. -Hydrate.  Hyponatremia-sodium 129.  Recent baseline mid to high 130s.  Likely related to binge drinking. -Hydrate.  Seizure history-no seizures in months, reports compliance with medications-topiramate, she is on as needed Nayzilam. She sees a  neurologist at [facility].. -Resume Topamax -Pending med reconciliation resume clobazam  History of ITP-platelets normal 255, -Reports she is on Tavalisse (Fostamatinib), I am unable to confirm dose or frequency, med reconciliation is pending.   - Medication list also includes several unusual medications including Promacta, travalisse, which she reports she is not taking.  Awaiting med reconciliation.  PTSD/depression/suicide attempt history-  -Reports she is no longer on Seroquel, -Pending med reconciliation resume Trintellix  Chronic pain on chronic opioids -Hold chronic opioids for now, she reports she is on oxycodone as needed.  DVT prophylaxis: Lovenox Code Status: Full code Disposition Plan: ~ 1- 2 days Consults called: None Admission status: Inpt, tele   Bethena Roys MD [facility]  08/25/2020, 11:00 PM

## 2020-08-25 NOTE — ED Provider Notes (Addendum)
Patient now awake. Answering questions appropriately. States she drank heavily last night does not normally drink heavily. Denies taking anything else. Patient remains tachycardic. Appears to be significantly dehydrated. Continues to have loose bowel movements. No blood in the bowel movements. Patient denies any abdominal pain. States she felt fine yesterday.  Based on her lactic acid being 4.8. White count being at 14,000 range and her vital signs were she is tachycardic although does not have a fever and respiratory rates up have ordered septic protocol to cover her with antibiotics. Although no fever. Patient symptoms could be just secondary to acute alcohol intoxication. There is no anion gap and although she is very acidotic alcohol ketoacidosis I guess is a possibility.  Patient's platelet counts are stable. Because of the tachycardia also did order CIWA protocol for her. In case she goes through withdrawal but she should not based on the fact that she denies drinking regularly. But that time we did not know that.  CK total and CK-MB are still pending. Not sure whether they run those here on a regular basis but they have been in process since about 130. Also ordered Covid testing for her.  We will discuss with hospitalist for admission. Is reassuring that patient is mentating better.   Vanetta Mulders, MD 08/25/20 1721    Vanetta Mulders, MD 08/25/20 1730  Patient's venous blood gas the pH 7.34 which is reassuring.  Patient told the nurse that she had been binge drinking over the weekend.  Not really eating.  Does not drink on a regular basis.   Vanetta Mulders, MD 08/25/20 [phone]
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
## 2020-08-25 NOTE — ED Notes (Signed)
Foley placed by Misty Stanley Emt/nt at this time sample obtained at this time and sent to lab Tamryn Popko

## 2020-08-25 NOTE — Progress Notes (Signed)
Received critical lab value (Lactic acid 5.8). Notified MD via phone and received orders for CT and bolus.  Patient is alert and vitals stable.  No visible distress at this time.  Will carry out orders per MD.

## 2020-08-25 NOTE — Progress Notes (Signed)
Pharmacy Antibiotic Note  Leslie [name] is a 45 y.o. female admitted on 08/25/2020 with unknown source.  Pharmacy has been consulted for Vancomycin and cefepime dosing.  Plan: Vancomycin 1750mg  IV loading dose, then 1000mg  IV every 8 hours.  Goal trough 15-20 mcg/mL.  Cefepime 2gm IV q8h F/U cxs and clinical progress Monitor V/S, labs and levels as indicated  Height: 5\' 2"  (157.5 cm) Weight: 90.7 kg (200 lb) IBW/kg (Calculated) : 50.1  Temp (24hrs), Avg:98.4 F (36.9 C), Min:97.9 F (36.6 C), Max:98.8 F (37.1 C)  Recent Labs  Lab 08/25/20 1242 08/25/20 1544  WBC 14.9*  --   CREATININE 0.56  --   LATICACIDVEN  --  4.8*    Estimated Creatinine Clearance: 94.9 mL/min (by C-G formula based on SCr of 0.56 mg/dL).    Allergies  Allergen Reactions  . Sodium Ferric Gluconate [Ferrous Gluconate] Anaphylaxis  . Ace Inhibitors Cough  . Paroxetine Other (See Comments)    H/O ITP, bleeding risk  . Rituximab Other (See Comments)    Patient developed seizures; this happened previously with rituximab in 2014  . Bupropion Other (See Comments) and Rash    H/o seizures Contraindicated due to seizures H/o seizures  . Venlafaxine Rash    Didn't work Didn't work Didn't work    Antimicrobials this admission: Vancomycin 3/6 >>  Cefepime 3/6 >>   Dose adjustments this admission: prn  Microbiology results: 3/6 BCx: pending 3/6 UCx: pending  MRSA PCR:   Thank you for allowing pharmacy to be a part of this patient's care.  10/25/20, BS 10/25/20, 2015 Clinical Pharmacist Pager [phone] 08/25/2020 5:14 PM
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
## 2020-08-25 NOTE — Progress Notes (Signed)
elink monitoring for sepsis. 

## 2020-08-25 NOTE — Progress Notes (Signed)
Contacted Dr. Mariea Clonts over concern with Lactic Acid level x 2 of 4.8, asking for F/U lactic orders, MD immediately placed order. TY

## 2020-08-26 DIAGNOSIS — F10929 Alcohol use, unspecified with intoxication, unspecified: Secondary | ICD-10-CM | POA: Diagnosis not present

## 2020-08-26 DIAGNOSIS — R4182 Altered mental status, unspecified: Secondary | ICD-10-CM | POA: Diagnosis not present

## 2020-08-26 DIAGNOSIS — I10 Essential (primary) hypertension: Secondary | ICD-10-CM

## 2020-08-26 DIAGNOSIS — G894 Chronic pain syndrome: Secondary | ICD-10-CM

## 2020-08-26 DIAGNOSIS — E872 Acidosis: Secondary | ICD-10-CM

## 2020-08-26 DIAGNOSIS — R197 Diarrhea, unspecified: Secondary | ICD-10-CM | POA: Diagnosis not present

## 2020-08-26 DIAGNOSIS — G40909 Epilepsy, unspecified, not intractable, without status epilepticus: Secondary | ICD-10-CM

## 2020-08-26 LAB — BASIC METABOLIC PANEL
Anion gap: 10 (ref 5–15)
BUN: 10 mg/dL (ref 6–20)
CO2: 18 mmol/L — ABNORMAL LOW (ref 22–32)
Calcium: 6.9 mg/dL — ABNORMAL LOW (ref 8.9–10.3)
Chloride: 107 mmol/L (ref 98–111)
Creatinine, Ser: 0.56 mg/dL (ref 0.44–1.00)
GFR, Estimated: >60 mL/min (ref >60)
Glucose, Bld: 108 mg/dL — ABNORMAL HIGH (ref 70–99)
Potassium: 3.4 mmol/L — ABNORMAL LOW (ref 3.5–5.1)
Sodium: 135 mmol/L (ref 135–145)

## 2020-08-26 LAB — LACTIC ACID, PLASMA: Lactic Acid, Venous: 2.8 mmol/L (ref 0.5–1.9)

## 2020-08-26 LAB — HIV ANTIBODY (ROUTINE TESTING W REFLEX): HIV Screen 4th Generation wRfx: NONREACTIVE

## 2020-08-26 LAB — CBC
HCT: 37.9 % (ref 36.0–46.0)
Hemoglobin: 12.6 g/dL (ref 12.0–15.0)
MCH: 33.9 pg (ref 26.0–34.0)
MCHC: 33.2 g/dL (ref 30.0–36.0)
MCV: 101.9 fL — ABNORMAL HIGH (ref 80.0–100.0)
Platelets: 207 10*3/uL (ref 150–400)
RBC: 3.72 MIL/uL — ABNORMAL LOW (ref 3.87–5.11)
RDW: 14.1 % (ref 11.5–15.5)
WBC: 17 10*3/uL — ABNORMAL HIGH (ref 4.0–10.5)
nRBC: 0.2 % (ref 0.0–0.2)

## 2020-08-26 LAB — PROCALCITONIN: Procalcitonin: <0.1 ng/mL

## 2020-08-26 LAB — VITAMIN B12: Vitamin B-12: 103 pg/mL — ABNORMAL LOW (ref 180–914)

## 2020-08-26 MED ORDER — POTASSIUM CHLORIDE CRYS ER 20 MEQ PO TBCR
40.0000 meq | EXTENDED_RELEASE_TABLET | Freq: Once | ORAL | Status: AC
  Administered 2020-08-26: 40 meq via ORAL
  Filled 2020-08-26: qty 2

## 2020-08-26 MED ORDER — MAGNESIUM SULFATE 2 GM/50ML IV SOLN
2.0000 g | Freq: Once | INTRAVENOUS | Status: AC
  Administered 2020-08-26: 2 g via INTRAVENOUS
  Filled 2020-08-26: qty 50

## 2020-08-26 MED ORDER — CHLORHEXIDINE GLUCONATE CLOTH 2 % EX PADS
6.0000 | MEDICATED_PAD | Freq: Every day | CUTANEOUS | Status: DC
  Administered 2020-08-26 – 2020-08-27 (×2): 6 via TOPICAL

## 2020-08-26 NOTE — Clinical Social Work Note (Signed)
TOC consulted to provide patient with SA resources. Patient offered SA resources. Patient declined SA resources.  TOC signing off.   Tirzah Fross, Juleen China, LCSW

## 2020-08-26 NOTE — Progress Notes (Signed)
Central Telemetry called in regards to heart rate 140s, noted to be Sinus Tach upon inspection. Patient reports she was up to bathroom and only complaint is of leg pain 7/10. Upon reassessment of vitals when patient settled back to bed, HR had decreased to 112, oxygen sats 97%. Val, LPN notified of findings and complaints.

## 2020-08-26 NOTE — Progress Notes (Signed)
   08/26/20 1649  Assess: MEWS Score  Temp 99.1 F (37.3 C)  BP 127/75  Pulse Rate (!) 124  Resp 17  Level of Consciousness Alert  SpO2 95 %  O2 Device Room Air  Assess: MEWS Score  MEWS Temp 0  MEWS Systolic 0  MEWS Pulse 2  MEWS RR 0  MEWS LOC 0  MEWS Score 2  MEWS Score Color Yellow  Assess: if the MEWS score is Yellow or Red  Were vital signs taken at a resting state? No  Focused Assessment No change from prior assessment  Early Detection of Sepsis Score *See Row Information* Low  MEWS guidelines implemented *See Row Information* Yes  Treat  MEWS Interventions Escalated (See documentation below)  Pain Scale 0-10  Pain Score 3  Take Vital Signs  Increase Vital Sign Frequency  Yellow: Q 2hr X 2 then Q 4hr X 2, if remains yellow, continue Q 4hrs  Escalate  MEWS: Escalate Yellow: discuss with charge nurse/RN and consider discussing with provider and RRT  Notify: Charge Nurse/RN  Name of Charge Nurse/RN Notified Jimmie Molly RN  Date Charge Nurse/RN Notified 08/26/20  Time Charge Nurse/RN Notified 1650

## 2020-08-26 NOTE — Progress Notes (Signed)
PROGRESS NOTE    Leslie [name]  WCH:[id number] DOB: [birth year] DOA: 08/25/2020 PCP: Rolm Gala, MD   Chief Complaint  Patient presents with  . Alcohol Intoxication    Brief admission Narrative:  Leslie [name] is a 45 y.o. female with medical history significant for idiopathic thrombocytopenic purpura, alcohol abuse in remission, seizures, chronic pain on chronic opiates, anxiety, depression, suicide attempt. Patient was brought to the ED via EMS reports of altered mental status.  Patient was housesitting, the homeowners arrived at about 1:30 PM last night and found patient on the floor, and she had stool on her.  On arrival to the ED patient's mental status gradually improved becoming more awake alert. On my evaluation mental status is improving, she is able to answer questions , she reports she drank a lot of alcohol last night, she is unable to quantify at this time.  She reports that before this she had not drank any alcohol in 4 years.  She denies any triggers or recent stressors prompting her to binge drink last night. She reports onset of watery stools today.  No vomiting.  No abdominal pain.  Reports no seizures in several years.  ED Course: Temperature 97.9.  Rectal 141.  Respiratory rate 19-34.  Blood pressure systolic 120s to 353I.  O2 sats greater than 96% on room air.  Blood alcohol level 323, lactic acid 4.8 x 2 , after 1 L bolus of fluids.  WBC 14.3.  Sodium 129.  With concern for sepsis, full sepsis fluids given in ED.  Started on broad-spectrum antibiotics IV Vanco cefepime and metronidazole.  Assessment & Plan: 1-Alcohol intoxication (HCC) -Continue CIWA protocol -Continue IV fluids and hydration -Continue thiamine and folic acid -Will check B12 -Replete electrolytes as needed and follow response.  2-lactic acidosis -In the setting of alcohol abuse and dehydration -Continue IV fluids -Last repeat level 2.8; no signs of systemic infection.  3-Seizure disorder  (HCC) -Continue outpatient with neurologist -No seizures reported in months -Continue the use of Topamax  4-hx of PTSD/Recurrent major depressive disorder (HCC) -no SI or hallucinations -continue current antidepressant and anxiolytic meds.  5-HTN (hypertension)  6-hyponatremia -in the setting of alcohol abuse -continue IVF's -follow electrolyte trend.  7-Chronic pain -continue PRN analgesic meds at discharge  8-diarrhea/hypokalemia -continue electrolyte repletion -loose stools episodes improving.    DVT prophylaxis: Lovenox Code Status: Full code. Family Communication: no family at bedside.  Disposition:   Status is: Inpatient  Dispo: The patient is from: home.              Anticipated d/c is to: home               Patient currently no medically stable for discharge.  Continue fluid resuscitation, electrolyte repletion and advancing diet.   Difficult to place patient no      Consultants:   None    Procedures:    Antimicrobials:  Cefepime, Flagyl and vancomycin were given on admission.  Discontinue on 08/26/2020.   Subjective: No fever, no chest pain, no nausea, no vomiting.  Reports improvement in her loose stools episodes and is feeling hungry. No tremors seen and overall hemodynamically stable.  Procalcitonin level is low, suggesting no presence of systemic bacterial infection.   Objective: Vitals:   08/25/20 2100 08/26/20 0100 08/26/20 0130 08/26/20 0457  BP:   110/67 (!) 141/80  Pulse: (!) 115 (!) 107 94 (!) 105  Resp:   19 19  Temp:   98.2 F (36.8  C) 98.2 F (36.8 C)  TempSrc:   Oral   SpO2:   98% 98%  Weight:      Height:        Intake/Output Summary (Last 24 hours) at 08/26/2020 1141 Last data filed at 08/26/2020 0600 Gross per 24 hour  Intake 2983.15 ml  Output 900 ml  Net 2083.15 ml   Filed Weights   08/25/20 1241  Weight: 90.7 kg    Examination: General exam: Appears calm and comfortable, oriented x3; no fever, no chest pain, no  nausea, no vomiting. Reports improvement in her loose stools episodes. Respiratory system: Clear to auscultation. Respiratory effort normal.  Cardiovascular system: S1 & S2 heard, RRR. No JVD, murmurs, rubs, gallops or clicks. No pedal edema. Gastrointestinal system: Abdomen is obese, nondistended, soft and nontender. No organomegaly or masses felt. Normal bowel sounds heard. Central nervous system: Alert and oriented. No focal neurological deficits. Extremities: no cyanosis, no clubbing. Skin: No rashes, lesions or ulcers Psychiatry: Judgement and insight appear normal. Mood & affect appropriate.     Data Reviewed: I have personally reviewed following labs and imaging studies  CBC: Recent Labs  Lab 08/25/20 1242 08/26/20 0615  WBC 14.9* 17.0*  HGB 17.2* 12.6  HCT 52.2* 37.9  MCV 101.8* 101.9*  PLT 255 207    Basic Metabolic Panel: Recent Labs  Lab 08/25/20 1242 08/25/20 2114 08/26/20 0615  NA 129*  --  135  K 3.8  --  3.4*  CL 94*  --  107  CO2 21*  --  18*  GLUCOSE 98  --  108*  BUN 5*  --  10  CREATININE 0.56  --  0.56  CALCIUM 7.6*  --  6.9*  MG  --  1.7  --   PHOS  --  1.9*  --     GFR: Estimated Creatinine Clearance: 94.9 mL/min (by C-G formula based on SCr of 0.56 mg/dL).   Recent Results (from the past 240 hour(s))  Resp Panel by RT-PCR (Flu A&B, Covid) Nasopharyngeal Swab     Status: None   Collection Time: 08/25/20  4:52 PM   Specimen: Nasopharyngeal Swab; Nasopharyngeal(NP) swabs in vial transport medium  Result Value Ref Range Status   SARS Coronavirus 2 by RT PCR NEGATIVE NEGATIVE Final    Comment: (NOTE) SARS-CoV-2 target nucleic acids are NOT DETECTED.  The SARS-CoV-2 RNA is generally detectable in upper respiratory specimens during the acute phase of infection. The lowest concentration of SARS-CoV-2 viral copies this assay can detect is 138 copies/mL. A negative result does not preclude SARS-Cov-2 infection and should not be used as the sole  basis for treatment or other patient management decisions. A negative result may occur with  improper specimen collection/handling, submission of specimen other than nasopharyngeal swab, presence of viral mutation(s) within the areas targeted by this assay, and inadequate number of viral copies(<138 copies/mL). A negative result must be combined with clinical observations, patient history, and epidemiological information. The expected result is Negative.  Fact Sheet for Patients:  BloggerCourse.com  Fact Sheet for Healthcare Providers:  SeriousBroker.it  This test is no t yet approved or cleared by the [place] FDA and  has been authorized for detection and/or diagnosis of SARS-CoV-2 by FDA under an Emergency Use Authorization (EUA). This EUA will remain  in effect (meaning this test can be used) for the duration of the COVID-19 declaration under Section 564(b)(1) of the Act, 21 U.S.C.section 360bbb-3(b)(1), unless the authorization is terminated  or revoked  sooner.       Influenza A by PCR NEGATIVE NEGATIVE Final   Influenza B by PCR NEGATIVE NEGATIVE Final    Comment: (NOTE) The Xpert Xpress SARS-CoV-2/FLU/RSV plus assay is intended as an aid in the diagnosis of influenza from Nasopharyngeal swab specimens and should not be used as a sole basis for treatment. Nasal washings and aspirates are unacceptable for Xpert Xpress SARS-CoV-2/FLU/RSV testing.  Fact Sheet for Patients: BloggerCourse.comhttps://www.fda.gov/media/152166/download  Fact Sheet for Healthcare Providers: SeriousBroker.ithttps://www.fda.gov/media/152162/download  This test is not yet approved or cleared by the [place] FDA and has been authorized for detection and/or diagnosis of SARS-CoV-2 by FDA under an Emergency Use Authorization (EUA). This EUA will remain in effect (meaning this test can be used) for the duration of the COVID-19 declaration under Section 564(b)(1) of the Act,  21 U.S.C. section 360bbb-3(b)(1), unless the authorization is terminated or revoked.  Performed at [facility], [address] [place], [place] [address]   Culture, blood (single)     Status: None (Preliminary result)   Collection Time: 08/25/20  4:59 PM   Specimen: Right Antecubital; Blood  Result Value Ref Range Status   Specimen Description RIGHT ANTECUBITAL  Final   Special Requests   Final    BOTTLES DRAWN AEROBIC AND ANAEROBIC Blood Culture adequate volume   Culture   Final    NO GROWTH < 24 HOURS Performed at [facility], [address] [place], [place] [address]    Report Status PENDING  Incomplete  C Difficile Quick Screen w PCR reflex     Status: None   Collection Time: 08/25/20  7:13 PM   Specimen: STOOL  Result Value Ref Range Status   C Diff antigen NEGATIVE NEGATIVE Final   C Diff toxin NEGATIVE NEGATIVE Final   C Diff interpretation No C. difficile detected.  Final    Comment: Performed at [facility], [address] [place], [place] [address]     Radiology Studies: CT Head Wo Contrast  Result Date: 08/25/2020 CLINICAL DATA:  Head trauma.  Coagulopathy.  Pain. EXAM: CT HEAD WITHOUT CONTRAST TECHNIQUE: Contiguous axial images were obtained from the base of the skull through the vertex without intravenous contrast. COMPARISON:  None. FINDINGS: Brain: No evidence of acute infarction, hemorrhage, hydrocephalus, extra-axial collection or mass lesion/mass effect. Vascular: No hyperdense vessel or unexpected calcification. Skull: Normal. Negative for fracture or focal lesion. Sinuses/Orbits: No acute finding. Other: None. IMPRESSION: No acute intracranial abnormalities identified. Electronically Signed   By: Gerome Samavid  Williams III M.D   On: 08/25/2020 13:29   DG Chest Port 1 View  Result Date: 08/25/2020 CLINICAL DATA:  Acute mental status change. EXAM: PORTABLE CHEST 1 VIEW COMPARISON:  August 13, 2003 FINDINGS: A right Port-A-Cath terminates in good position. The heart,  hila, mediastinum, lungs, and pleura are normal. No acute abnormalities. IMPRESSION: No active disease. Electronically Signed   By: Gerome Samavid  Williams III M.D   On: 08/25/2020 13:30   CT Angio Abd/Pel w/ and/or w/o  Result Date: 08/26/2020 CLINICAL DATA:  Diarrhea Leukocytosis Worsening lactic acidosis despite fluid Alcohol intoxication EXAM: CTA ABDOMEN AND PELVIS WITHOUT AND WITH CONTRAST TECHNIQUE: Multidetector CT imaging of the abdomen and pelvis was performed using the standard protocol during bolus administration of intravenous contrast. Multiplanar reconstructed images and MIPs were obtained and reviewed to evaluate the vascular anatomy. CONTRAST:  100mL OMNIPAQUE IOHEXOL 350 MG/ML SOLN COMPARISON:  CT abdomen pelvis 07/16/2004 FINDINGS: VASCULAR Aorta: Normal caliber aorta without aneurysm, dissection, vasculitis or significant stenosis.  Celiac: Patent without evidence of aneurysm, dissection, vasculitis or significant stenosis. SMA: Patent without evidence of aneurysm, dissection, vasculitis or significant stenosis. Renals: Both renal arteries are patent without evidence of aneurysm, dissection, vasculitis, fibromuscular dysplasia or significant stenosis. IMA: Patent without evidence of aneurysm, dissection, vasculitis or significant stenosis. Inflow: Patent without evidence of aneurysm, dissection, vasculitis or significant stenosis. Proximal Outflow: Bilateral common femoral and visualized portions of the superficial and profunda femoral arteries are patent without evidence of aneurysm, dissection, vasculitis or significant stenosis. Veins: No obvious venous abnormality within the limitations of this arterial phase study. Review of the MIP images confirms the above findings. NON-VASCULAR Lower chest: No acute abnormality. Hepatobiliary: Status post cholecystectomy. No bile duct dilatation. No significant abnormality of the liver. Pancreas: Unremarkable. No pancreatic ductal dilatation or surrounding  inflammatory changes. Spleen: Status post splenectomy. Tiny residual splenule noted in the left upper quadrant is to the tail the spleen. Adrenals/Urinary Tract: Adrenal glands are normal in appearance. 4 mm nonobstructing calculus present in the upper pole of the left kidney. Kidneys and ureters are otherwise normal. Limited evaluation of the bladder due to collapse configuration. Foley catheter noted within the bladder. 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 No bowel dilatation to indicate ileus or obstruction. Appendix is normal. Lymphatic: No enlarged lymph nodes. Reproductive: Uterus and bilateral adnexa are unremarkable. Other: Region of focal fat stranding in the left lateral pelvic subcutaneous tissues with surrounding rim of soft tissue density is most likely a region of fat necrosis. A lipomatous lesion is considered less likely. Musculoskeletal: Moderate compression deformity of the L1 vertebral body is unchanged since 2006. Vertebral body heights are otherwise maintained. IMPRESSION: VASCULAR 1. No significant abnormality of the arterial vasculature of the abdomen and pelvis. NON-VASCULAR 1. 4 mm nonobstructing left renal calculus.  No hydronephrosis. 2. Chronic moderate L1 compression fracture unchanged from 2006. Electronically Signed   By: Acquanetta Belling M.D.   On: 08/26/2020 07:56   Scheduled Meds: . Chlorhexidine Gluconate Cloth  6 each Topical Daily  . enoxaparin (LOVENOX) injection  40 mg Subcutaneous Q24H  . folic acid  1 mg Oral Daily  . LORazepam  0-4 mg Intravenous Q6H   Followed by  . [START ON 08/27/2020] LORazepam  0-4 mg Intravenous Q12H  . phosphorus  250 mg Oral TID  . thiamine  100 mg Oral Daily   Or  . thiamine  100 mg Intravenous Daily  . topiramate  200 mg Oral BID   Continuous Infusions: . dextrose 5 % and 0.9 % NaCl with KCl 20 mEq/L 100 mL/hr at 08/26/20 0157     LOS: 1 day    Time spent: 30  minutes.   Vassie Loll, MD [facility]   To contact the attending provider between 7A-7P or the covering provider during after hours 7P-7A, please log into the web site [email or web address] and access using universal [place] password for that web site. If you do not have the password, please call the hospital operator.  08/26/2020, 11:41 AM
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
## 2020-08-27 DIAGNOSIS — E872 Acidosis, unspecified: Secondary | ICD-10-CM

## 2020-08-27 DIAGNOSIS — R197 Diarrhea, unspecified: Secondary | ICD-10-CM | POA: Diagnosis not present

## 2020-08-27 DIAGNOSIS — Z862 Personal history of diseases of the blood and blood-forming organs and certain disorders involving the immune mechanism: Secondary | ICD-10-CM | POA: Diagnosis not present

## 2020-08-27 DIAGNOSIS — G894 Chronic pain syndrome: Secondary | ICD-10-CM | POA: Diagnosis not present

## 2020-08-27 DIAGNOSIS — F10929 Alcohol use, unspecified with intoxication, unspecified: Secondary | ICD-10-CM | POA: Diagnosis not present

## 2020-08-27 LAB — BASIC METABOLIC PANEL
Anion gap: 8 (ref 5–15)
BUN: <5 mg/dL — ABNORMAL LOW (ref 6–20)
CO2: 20 mmol/L — ABNORMAL LOW (ref 22–32)
Calcium: 7.8 mg/dL — ABNORMAL LOW (ref 8.9–10.3)
Chloride: 111 mmol/L (ref 98–111)
Creatinine, Ser: 0.49 mg/dL (ref 0.44–1.00)
GFR, Estimated: >60 mL/min (ref >60)
Glucose, Bld: 114 mg/dL — ABNORMAL HIGH (ref 70–99)
Potassium: 3.1 mmol/L — ABNORMAL LOW (ref 3.5–5.1)
Sodium: 139 mmol/L (ref 135–145)

## 2020-08-27 LAB — MAGNESIUM: Magnesium: 2 mg/dL (ref 1.7–2.4)

## 2020-08-27 MED ORDER — SACCHAROMYCES BOULARDII 250 MG PO CAPS: 250.0000 mg | ORAL_CAPSULE | Freq: Two times a day (BID) | ORAL | 0 refills | Status: AC

## 2020-08-27 MED ORDER — MAGNESIUM OXIDE 400 MG PO TABS: 400.0000 mg | ORAL_TABLET | Freq: Every day | ORAL | 1 refills | Status: AC

## 2020-08-27 MED ORDER — CYANOCOBALAMIN 1000 MCG/ML IJ SOLN: INTRAMUSCULAR | 0 refills | Status: AC

## 2020-08-27 MED ORDER — POTASSIUM CHLORIDE CRYS ER 20 MEQ PO TBCR
40.0000 meq | EXTENDED_RELEASE_TABLET | Freq: Once | ORAL | Status: AC
  Administered 2020-08-27: 40 meq via ORAL
  Filled 2020-08-27: qty 2

## 2020-08-27 MED ORDER — CHLORDIAZEPOXIDE HCL 10 MG PO CAPS: ORAL_CAPSULE | ORAL | 0 refills | Status: AC

## 2020-08-27 NOTE — Discharge Summary (Signed)
Physician Discharge Summary  HEENA WOODBURY UUE:[id number] DOB: [birth year] DOA: 08/25/2020  PCP: Hortencia Pilar, MD  Admit date: 08/25/2020 Discharge date: 08/27/2020  Time spent: 35 minutes  Recommendations for Outpatient Follow-up:  1. Repeat basic metabolic panel, magnesium and phosphorus level to evaluate electrolytes trend and determine the need of anticoagulation. 2. Reassess blood pressure and initiate antihypertensive treatment is required. 3. Continue assisting patient with alcohol abstinence and weight loss management.   Discharge Diagnoses:  Principal Problem:   Alcohol intoxication ([place]) Active Problems:   Seizure disorder ([place])   Recurrent major depressive disorder ([facility])   PTSD (post-traumatic stress disorder)   HTN (hypertension)   Chronic pain   Alcohol dependence in remission ([place])   History of attempted suicide   Chronic, continuous use of opioids   History of ITP   Hyponatremia   Diarrhea   SIRS (systemic inflammatory response syndrome) (H[facility])   Metabolic acidosis   Discharge Condition: Stable and improved.  Discharged home with instruction to follow-up with PCP in 10 days.  CODE STATUS full code.  Diet recommendation: Low calorie and heart healthy diet.  Filed Weights   08/25/20 1241  Weight: 90.7 kg    History of present illness:  VASHON ARCH a 45 y.o.femalewith medical history significant foridiopathic thrombocytopenic purpura, alcohol abuse in remission, seizures, chronic pain on chronic opiates, anxiety, depression, suicide attempt. Patient was brought to the ED via EMS reports of altered mental status. Patient was housesitting, the homeowners arrived at about 1:30 PM last night and found patient on the floor,andshehad stool on her. On arrival to the ED patient'smental status gradually improved becomingmore awake alert. On my evaluation mental status is improving,she is able to answer questions , she reports she dranka lotof  alcohol last night, she is unable to quantify at this time. She reports that before this she had not drank any alcohol in 4 years. She denies any triggers or recent stressors prompting her to binge drink last night. Shereports onset of watery stools today. No vomiting. No abdominal pain. Reports no seizures in several years.  ED Course:Temperature 97.9. Rectal 141. Respiratory rate 19-34. Blood pressure systolic 915A to 569V.O2 sats greater than 96% on room air. Blood alcohol level 323,lactic acid 4.8x 2 ,after 1 L bolus of fluids.WBC 14.3. Sodium 129. With concern for sepsis, full sepsis fluids given in ED. Started on broad-spectrum antibiotics IV Vanco cefepime and metronidazole.  Hospital Course:  1-Alcohol intoxication [facility]) -Patient kept on CIWA protocol using Ativan monitor no significant symptoms appreciated. -Discharged on Librium tapering. -Continue thiamine and folic acid -X48 level 016 (see below for treatment details). -Continue electrolytes repletion and maintenance supplementation.  2-lactic acidosis -In the setting of alcohol abuse and dehydration -Improved/resolved with fluid resuscitation -Patient advised to maintain adequate hydration.  3-Seizure disorder ([place]) -Continue outpatient with neurologist -No seizures reported in months -Continue the use of Topamax  4-hx of PTSD/Recurrent major depressive disorder ([place]) -no SI or hallucinations -Overall mood is a stable currently. -Resume prior to admission antidepressant and anxiolytic meds. -Outpatient follow-up with psychiatry service recommended.  5-HTN (hypertension) -Not receiving antihypertensive agents prior toadmission -Continue to follow heart healthy diet.  6-hyponatremia -in the setting of alcohol abuse -Improved and overall stable at discharge -Advised to maintain adequate hydration and nutrition. -Replete asymptomatic down to evaluate lites trend.  7-Chronic pain -continue  PRN analgesic meds at discharge -Continue outpatient follow-up with pain clinic.  8-diarrhea/hypokalemia/hypomagnesemia/hypophosphatemia -Electrolytes repleted and patient discharge on maintenance supplementation. -Repeat basic metabolic panel,  magnesium and phosphorus level at follow-up visit -Further repletion as needed. -Patient advised to maintain adequate hydration/nutrition and to avoid alcohol consumption. -Prescription for Florastor probiotic at discharge.  9-B12 deficiency -B12 level 103 -Prescription provided for B12 injection daily for 7 days, weekly for a month and monthly after that. 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 -Low calorie diet, portion control and increased physical activity discussed with patient.  Procedures:  See below for x-ray reports.  Consultations:  None  Discharge Exam: Vitals:   08/27/20 0428 08/27/20 1419  BP: (!) 142/8 133/62  Pulse: 97 (!) 110  Resp: 18   Temp: 98.4 F (36.9 C) 98 F (36.7 C)  SpO2: 100% 98%    General: Afebrile, chest pain, no nausea, no vomiting, tolerating diet without problems.  Expressing minimal loose stools episodes.  No significant withdrawal symptoms appreciated currently. Cardiovascular: Rate controlled, sinus rhythm; no chest pain, no JVD, no rubs or gallops. Respiratory: Good air movement bilaterally, no using accessory muscle.  Good saturation on room air. Abdomen: Soft, nontender, nondistended, positive bowel sounds Extremities: No cyanosis or clubbing.  Discharge Instructions   Discharge Instructions    Discharge instructions   Complete by: As directed    The medications are prescribed Resume outpatient follow-up with pain clinic Follow-up with PCP in 10 days Maintain adequate hydration and nutrition Stop alcohol consumption   Increase activity slowly   Complete by: As directed      Allergies as of 08/27/2020      Reactions   Sodium Ferric  Gluconate [ferrous Gluconate] Anaphylaxis   Ace Inhibitors Cough   Paroxetine Other (See Comments)   H/O ITP, bleeding risk   Rituximab Other (See Comments)   Patient developed seizures; this happened previously with rituximab in 2014   Bupropion Other (See Comments), Rash   H/o seizures Contraindicated due to seizures H/o seizures   Venlafaxine Rash   Didn't work Didn't work Didn't work      Medication List    STOP taking these medications   DEXAMETHASONE PO   HYDROcodone-acetaminophen 10-325 MG tablet Commonly known as: NORCO   predniSONE 20 MG tablet Commonly known as: DELTASONE   QUEtiapine 25 MG tablet Commonly known as: SEROQUEL   senna-docusate 8.6-50 MG tablet Commonly known as: Senokot-S   tamsulosin 0.4 MG Caps capsule Commonly known as: FLOMAX     TAKE these medications   acetaminophen 325 MG tablet Commonly known as: TYLENOL Take 325 mg by mouth every 12 (twelve) hours. Take 2 tablets twice a day   buprenorphine 2 MG Subl SL tablet Commonly known as: SUBUTEX Place 2 mg under the tongue daily. Start taking on: September 03, 2020 What changed: Another medication with the same name was removed. Continue taking this medication, and follow the directions you see here.   chlordiazePOXIDE 10 MG capsule Commonly known as: LIBRIUM Take 3 tablets by mouth daily x2 days; then take 2 tablets by mouth daily x3 days; then take 1 tablet by mouth daily x3 days; then take half tablet by mouth daily x3 days and is still bleeding.   cloBAZam 10 MG tablet Commonly known as: ONFI Take 20 mg by mouth. Take 20 mg at night   EPINEPHrine 0.3 mg/0.3 mL Soaj injection Commonly known as: EPI-PEN Inject into the muscle.   fostamatinib disodium 100 MG tablet Commonly known as: TAVALISSE Take 100 mg by mouth 2 (two) times daily.   gabapentin 300 MG capsule  Commonly known as: NEURONTIN Take by mouth. Take 381m in the morning, take 3048min the afternoon, and take 600 mg at  night   Hydrocortisone Ace-Pramoxine 2.5-1 % Crea Apply three times daily as needed   magnesium oxide 400 MG tablet Commonly known as: MAG-OX Take 1 tablet (400 mg total) by mouth daily.   naloxone 4 MG/0.1ML Liqd nasal spray kit Commonly known as: NARCAN One spray in nostril if patient is not breathing; call 911; if needed repeat after 2 minutes   Nayzilam 5 MG/0.1ML Soln Generic drug: Midazolam Administer as needed   norelgestromin-ethinyl estradiol 150-35 MCG/24HR transdermal patch Commonly known as: ORTHO EVRA Place 1 patch onto the skin once a week.   ondansetron 4 MG disintegrating tablet Commonly known as: ZOFRAN-ODT Take 4 mg by mouth daily as needed.   oxyCODONE 5 MG immediate release tablet Commonly known as: Oxy IR/ROXICODONE Take 5 mg by mouth 3 (three) times daily as needed.   polyethylene glycol 17 g packet Commonly known as: MIRALAX / GLYCOLAX Take 17 g by mouth daily as needed.   potassium chloride 20 MEQ packet Commonly known as: KLOR-CON Take 20 mEq by mouth daily.   Promacta 50 MG tablet Generic drug: eltrombopag Take 100 mg by mouth daily. What changed: Another medication with the same name was removed. Continue taking this medication, and follow the directions you see here.   saccharomyces boulardii 250 MG capsule Commonly known as: Florastor Take 1 capsule (250 mg total) by mouth 2 (two) times daily.   topiramate 200 MG tablet Commonly known as: TOPAMAX Take 200 mg by mouth 2 (two) times daily.   Trintellix 10 MG Tabs tablet Generic drug: vortioxetine HBr TAKE 1 AND 1/2 TABLETS BY MOUTH EVERY DAY   vitamin B-12 1000 MCG tablet Commonly known as: CYANOCOBALAMIN Take 1,000 mcg by mouth daily.      Allergies  Allergen Reactions  . Sodium Ferric Gluconate [Ferrous Gluconate] Anaphylaxis  . Ace Inhibitors Cough  . Paroxetine Other (See Comments)    H/O ITP, bleeding risk  . Rituximab Other (See Comments)    Patient developed  seizures; this happened previously with rituximab in 2014  . Bupropion Other (See Comments) and Rash    H/o seizures Contraindicated due to seizures H/o seizures  . Venlafaxine Rash    Didn't work Didn't work Didn't work    Follow-up Information    [place], HeIshmael HolterMD. Schedule an appointment as soon as possible for a visit in 1042ay(s).   Specialty: Family Medicine Contact information: [phone] [phone]             The results of significant diagnostics from this hospitalization (including imaging, microbiology, ancillary and laboratory) are listed below for reference.    Significant Diagnostic Studies: CT Head Wo Contrast  Result Date: 08/25/2020 CLINICAL DATA:  Head trauma.  Coagulopathy.  Pain. EXAM: CT HEAD WITHOUT CONTRAST TECHNIQUE: Contiguous axial images were obtained from the base of the skull through the vertex without intravenous contrast. COMPARISON:  None. FINDINGS: Brain: No evidence of acute infarction, hemorrhage, hydrocephalus, extra-axial collection or mass lesion/mass effect. Vascular: No hyperdense vessel or unexpected calcification. Skull: Normal. Negative for fracture or focal lesion. Sinuses/Orbits: No acute finding. Other: None. IMPRESSION: No acute intracranial abnormalities identified. Electronically Signed   By: DaDorise BullionII M.D   On: 08/25/2020 13:29   DG Chest Port 1 View  Result Date: 08/25/2020 CLINICAL DATA:  Acute mental status change.  EXAM: PORTABLE CHEST 1 VIEW COMPARISON:  August 13, 2003 FINDINGS: A right Port-A-Cath terminates in good position. The heart, hila, mediastinum, lungs, and pleura are normal. No acute abnormalities. IMPRESSION: No active disease. Electronically Signed   By: Dorise Bullion III M.D   On: 08/25/2020 13:30   CT Angio Abd/Pel w/ and/or w/o  Result Date: 08/26/2020 CLINICAL DATA:  Diarrhea Leukocytosis Worsening lactic acidosis despite fluid Alcohol intoxication EXAM: CTA  ABDOMEN AND PELVIS WITHOUT AND WITH CONTRAST TECHNIQUE: Multidetector CT imaging of the abdomen and pelvis was performed using the standard protocol during bolus administration of intravenous contrast. Multiplanar reconstructed images and MIPs were obtained and reviewed to evaluate the vascular anatomy. CONTRAST:  168m OMNIPAQUE IOHEXOL 350 MG/ML SOLN COMPARISON:  CT abdomen pelvis 07/16/2004 FINDINGS: VASCULAR Aorta: Normal caliber aorta without aneurysm, dissection, vasculitis or significant stenosis. Celiac: Patent without evidence of aneurysm, dissection, vasculitis or significant stenosis. SMA: Patent without evidence of aneurysm, dissection, vasculitis or significant stenosis. Renals: Both renal arteries are patent without evidence of aneurysm, dissection, vasculitis, fibromuscular dysplasia or significant stenosis. IMA: Patent without evidence of aneurysm, dissection, vasculitis or significant stenosis. Inflow: Patent without evidence of aneurysm, dissection, vasculitis or significant stenosis. Proximal Outflow: Bilateral common femoral and visualized portions of the superficial and profunda femoral arteries are patent without evidence of aneurysm, dissection, vasculitis or significant stenosis. Veins: No obvious venous abnormality within the limitations of this arterial phase study. Review of the MIP images confirms the above findings. NON-VASCULAR Lower chest: No acute abnormality. Hepatobiliary: Status post cholecystectomy. No bile duct dilatation. No significant abnormality of the liver. Pancreas: Unremarkable. No pancreatic ductal dilatation or surrounding inflammatory changes. Spleen: Status post splenectomy. Tiny residual splenule noted in the left upper quadrant is to the tail the spleen. Adrenals/Urinary Tract: Adrenal glands are normal in appearance. 4 mm nonobstructing calculus present in the upper pole of the left kidney. Kidneys and ureters are otherwise normal. Limited evaluation of the bladder  due to collapse configuration. Foley catheter noted within the bladder. 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 No bowel dilatation to indicate ileus or obstruction. Appendix is normal. Lymphatic: No enlarged lymph nodes. Reproductive: Uterus and bilateral adnexa are unremarkable. Other: Region of focal fat stranding in the left lateral pelvic subcutaneous tissues with surrounding rim of soft tissue density is most likely a region of fat necrosis. A lipomatous lesion is considered less likely. Musculoskeletal: Moderate compression deformity of the L1 vertebral body is unchanged since 2006. Vertebral body heights are otherwise maintained. IMPRESSION: VASCULAR 1. No significant abnormality of the arterial vasculature of the abdomen and pelvis. NON-VASCULAR 1. 4 mm nonobstructing left renal calculus.  No hydronephrosis. 2. Chronic moderate L1 compression fracture unchanged from 2006. Electronically Signed   By: FMiachel RouxM.D.   On: 08/26/2020 07:56    Microbiology: Recent Results (from the past 240 hour(s))  Resp Panel by RT-PCR (Flu A&B, Covid) Nasopharyngeal Swab     Status: None   Collection Time: 08/25/20  4:52 PM   Specimen: Nasopharyngeal Swab; Nasopharyngeal(NP) swabs in vial transport medium  Result Value Ref Range Status   SARS Coronavirus 2 by RT PCR NEGATIVE NEGATIVE Final    Comment: (NOTE) SARS-CoV-2 target nucleic acids are NOT DETECTED.  The SARS-CoV-2 RNA is generally detectable in upper respiratory specimens during the acute phase of infection. The lowest concentration of SARS-CoV-2 viral copies this assay can detect is 138 copies/mL. A negative result does not preclude SARS-Cov-2 infection  and should not be used as the sole basis for treatment or other patient management decisions. A negative result may occur with  improper specimen collection/handling, submission of specimen other than nasopharyngeal swab, presence of  viral mutation(s) within the areas targeted by this assay, and inadequate number of viral copies(<138 copies/mL). A negative result must be combined with clinical observations, patient history, and epidemiological information. The expected result is Negative.  Fact Sheet for Patients:  EntrepreneurPulse.com.au  Fact Sheet for Healthcare Providers:  IncredibleEmployment.be  This test is no t yet approved or cleared by the [place] FDA and  has been authorized for detection and/or diagnosis of SARS-CoV-2 by FDA under an Emergency Use Authorization (EUA). This EUA will remain  in effect (meaning this test can be used) for the duration of the COVID-19 declaration under Section 564(b)(1) of the Act, 21 U.S.C.section 360bbb-3(b)(1), unless the authorization is terminated  or revoked sooner.       Influenza A by PCR NEGATIVE NEGATIVE Final   Influenza B by PCR NEGATIVE NEGATIVE Final    Comment: (NOTE) The Xpert Xpress SARS-CoV-2/FLU/RSV plus assay is intended as an aid in the diagnosis of influenza from Nasopharyngeal swab specimens and should not be used as a sole basis for treatment. Nasal washings and aspirates are unacceptable for Xpert Xpress SARS-CoV-2/FLU/RSV testing.  Fact Sheet for Patients: EntrepreneurPulse.com.au  Fact Sheet for Healthcare Providers: IncredibleEmployment.be  This test is not yet approved or cleared by the [place] FDA and has been authorized for detection and/or diagnosis of SARS-CoV-2 by FDA under an Emergency Use Authorization (EUA). This EUA will remain in effect (meaning this test can be used) for the duration of the COVID-19 declaration under Section 564(b)(1) of the Act, 21 U.S.C. section 360bbb-3(b)(1), unless the authorization is terminated or revoked.  Performed at [facility], [address] [place], [place] [address]   Culture, blood (single)     Status:  None (Preliminary result)   Collection Time: 08/25/20  4:59 PM   Specimen: Right Antecubital; Blood  Result Value Ref Range Status   Specimen Description RIGHT ANTECUBITAL  Final   Special Requests   Final    BOTTLES DRAWN AEROBIC AND ANAEROBIC Blood Culture adequate volume   Culture   Final    NO GROWTH 2 DAYS Performed at [facility], [address], [place], [place] [address]    Report Status PENDING  Incomplete  C Difficile Quick Screen w PCR reflex     Status: None   Collection Time: 08/25/20  7:13 PM   Specimen: STOOL  Result Value Ref Range Status   C Diff antigen NEGATIVE NEGATIVE Final   C Diff toxin NEGATIVE NEGATIVE Final   C Diff interpretation No C. difficile detected.  Final    Comment: Performed at [facility], [address] [place], [place] [address]     Labs: Basic Metabolic Panel: Recent Labs  Lab 08/25/20 1242 08/25/20 2114 08/26/20 0615 08/27/20 0919  NA 129*  --  135 139  K 3.8  --  3.4* 3.1*  CL 94*  --  107 111  CO2 21*  --  18* 20*  GLUCOSE 98  --  108* 114*  BUN 5*  --  10 <5*  CREATININE 0.56  --  0.56 0.49  CALCIUM 7.6*  --  6.9* 7.8*  MG  --  1.7  --  2.0  PHOS  --  1.9*  --   --    CBC: Recent Labs  Lab  08/25/20 1242 08/26/20 0615  WBC 14.9* 17.0*  HGB 17.2* 12.6  HCT 52.2* 37.9  MCV 101.8* 101.9*  PLT 255 207   Cardiac Enzymes: Recent Labs  Lab 08/25/20 1332  CKTOTAL 18*  CKMB 0.5    Signed:  Barton Dubois MD.  [facility] 08/27/2020, 3:03 PM

## 2020-08-30 LAB — CULTURE, BLOOD (SINGLE)
Culture: NO GROWTH
Special Requests: ADEQUATE

## 2020-09-04 DIAGNOSIS — E559 Vitamin D deficiency, unspecified: Secondary | ICD-10-CM | POA: Diagnosis not present

## 2020-09-04 DIAGNOSIS — M722 Plantar fascial fibromatosis: Secondary | ICD-10-CM | POA: Diagnosis not present

## 2020-09-04 DIAGNOSIS — M19071 Primary osteoarthritis, right ankle and foot: Secondary | ICD-10-CM | POA: Diagnosis not present

## 2020-09-09 DIAGNOSIS — D693 Immune thrombocytopenic purpura: Secondary | ICD-10-CM | POA: Diagnosis not present

## 2020-09-09 DIAGNOSIS — D500 Iron deficiency anemia secondary to blood loss (chronic): Secondary | ICD-10-CM | POA: Diagnosis not present

## 2020-09-09 DIAGNOSIS — M19071 Primary osteoarthritis, right ankle and foot: Secondary | ICD-10-CM | POA: Diagnosis not present

## 2020-09-09 DIAGNOSIS — Z981 Arthrodesis status: Secondary | ICD-10-CM | POA: Diagnosis not present

## 2020-09-10 DIAGNOSIS — D693 Immune thrombocytopenic purpura: Secondary | ICD-10-CM | POA: Diagnosis not present

## 2020-09-10 DIAGNOSIS — D500 Iron deficiency anemia secondary to blood loss (chronic): Secondary | ICD-10-CM | POA: Diagnosis not present

## 2020-09-26 DIAGNOSIS — D689 Coagulation defect, unspecified: Secondary | ICD-10-CM | POA: Diagnosis not present

## 2020-09-26 DIAGNOSIS — Z4789 Encounter for other orthopedic aftercare: Secondary | ICD-10-CM | POA: Diagnosis not present

## 2020-09-26 DIAGNOSIS — Z9884 Bariatric surgery status: Secondary | ICD-10-CM | POA: Diagnosis not present

## 2020-09-26 DIAGNOSIS — M8580 Other specified disorders of bone density and structure, unspecified site: Secondary | ICD-10-CM | POA: Diagnosis not present

## 2020-09-26 DIAGNOSIS — Z862 Personal history of diseases of the blood and blood-forming organs and certain disorders involving the immune mechanism: Secondary | ICD-10-CM | POA: Diagnosis not present

## 2020-09-26 DIAGNOSIS — M722 Plantar fascial fibromatosis: Secondary | ICD-10-CM | POA: Diagnosis not present

## 2020-09-26 DIAGNOSIS — Z981 Arthrodesis status: Secondary | ICD-10-CM | POA: Diagnosis not present

## 2020-09-26 DIAGNOSIS — M19171 Post-traumatic osteoarthritis, right ankle and foot: Secondary | ICD-10-CM | POA: Diagnosis not present

## 2020-09-26 DIAGNOSIS — E559 Vitamin D deficiency, unspecified: Secondary | ICD-10-CM | POA: Diagnosis not present

## 2020-09-26 DIAGNOSIS — M19071 Primary osteoarthritis, right ankle and foot: Secondary | ICD-10-CM | POA: Diagnosis not present

## 2020-10-01 DIAGNOSIS — D693 Immune thrombocytopenic purpura: Secondary | ICD-10-CM | POA: Diagnosis not present

## 2020-10-01 DIAGNOSIS — D500 Iron deficiency anemia secondary to blood loss (chronic): Secondary | ICD-10-CM | POA: Diagnosis not present

## 2020-10-02 DIAGNOSIS — D500 Iron deficiency anemia secondary to blood loss (chronic): Secondary | ICD-10-CM | POA: Diagnosis not present

## 2020-10-02 DIAGNOSIS — D693 Immune thrombocytopenic purpura: Secondary | ICD-10-CM | POA: Diagnosis not present

## 2020-10-05 DIAGNOSIS — Z87891 Personal history of nicotine dependence: Secondary | ICD-10-CM | POA: Diagnosis not present

## 2020-10-05 DIAGNOSIS — I10 Essential (primary) hypertension: Secondary | ICD-10-CM | POA: Diagnosis not present

## 2020-10-05 DIAGNOSIS — R569 Unspecified convulsions: Secondary | ICD-10-CM | POA: Diagnosis not present

## 2020-10-05 DIAGNOSIS — Z888 Allergy status to other drugs, medicaments and biological substances status: Secondary | ICD-10-CM | POA: Diagnosis not present

## 2020-10-05 DIAGNOSIS — E538 Deficiency of other specified B group vitamins: Secondary | ICD-10-CM | POA: Diagnosis not present

## 2020-10-05 DIAGNOSIS — E559 Vitamin D deficiency, unspecified: Secondary | ICD-10-CM | POA: Diagnosis not present

## 2020-10-05 DIAGNOSIS — Z79899 Other long term (current) drug therapy: Secondary | ICD-10-CM | POA: Diagnosis not present

## 2020-10-05 DIAGNOSIS — Z79891 Long term (current) use of opiate analgesic: Secondary | ICD-10-CM | POA: Diagnosis not present

## 2020-10-05 DIAGNOSIS — D509 Iron deficiency anemia, unspecified: Secondary | ICD-10-CM | POA: Diagnosis not present

## 2020-10-05 DIAGNOSIS — Z9884 Bariatric surgery status: Secondary | ICD-10-CM | POA: Diagnosis not present

## 2020-10-05 DIAGNOSIS — D693 Immune thrombocytopenic purpura: Secondary | ICD-10-CM | POA: Diagnosis not present

## 2020-10-05 DIAGNOSIS — G894 Chronic pain syndrome: Secondary | ICD-10-CM | POA: Diagnosis not present

## 2020-10-05 DIAGNOSIS — Z87892 Personal history of anaphylaxis: Secondary | ICD-10-CM | POA: Diagnosis not present

## 2020-10-14 DIAGNOSIS — D693 Immune thrombocytopenic purpura: Secondary | ICD-10-CM | POA: Diagnosis not present

## 2020-10-14 DIAGNOSIS — D500 Iron deficiency anemia secondary to blood loss (chronic): Secondary | ICD-10-CM | POA: Diagnosis not present

## 2020-10-16 DIAGNOSIS — Z5181 Encounter for therapeutic drug level monitoring: Secondary | ICD-10-CM | POA: Diagnosis not present

## 2020-10-16 DIAGNOSIS — M19071 Primary osteoarthritis, right ankle and foot: Secondary | ICD-10-CM | POA: Diagnosis not present

## 2020-10-16 DIAGNOSIS — R531 Weakness: Secondary | ICD-10-CM | POA: Diagnosis not present

## 2020-10-16 DIAGNOSIS — Z79899 Other long term (current) drug therapy: Secondary | ICD-10-CM | POA: Diagnosis not present

## 2020-10-16 DIAGNOSIS — Z79891 Long term (current) use of opiate analgesic: Secondary | ICD-10-CM | POA: Diagnosis not present

## 2020-10-16 DIAGNOSIS — T8484XA Pain due to internal orthopedic prosthetic devices, implants and grafts, initial encounter: Secondary | ICD-10-CM | POA: Diagnosis not present

## 2020-10-16 DIAGNOSIS — G8918 Other acute postprocedural pain: Secondary | ICD-10-CM | POA: Diagnosis not present

## 2020-10-16 DIAGNOSIS — G894 Chronic pain syndrome: Secondary | ICD-10-CM | POA: Diagnosis not present

## 2020-10-16 DIAGNOSIS — M25571 Pain in right ankle and joints of right foot: Secondary | ICD-10-CM | POA: Diagnosis not present

## 2020-10-23 DIAGNOSIS — D500 Iron deficiency anemia secondary to blood loss (chronic): Secondary | ICD-10-CM | POA: Diagnosis not present

## 2020-10-23 DIAGNOSIS — D693 Immune thrombocytopenic purpura: Secondary | ICD-10-CM | POA: Diagnosis not present

## 2020-11-02 DIAGNOSIS — S8262XA Displaced fracture of lateral malleolus of left fibula, initial encounter for closed fracture: Secondary | ICD-10-CM | POA: Diagnosis not present

## 2020-11-02 DIAGNOSIS — M25572 Pain in left ankle and joints of left foot: Secondary | ICD-10-CM | POA: Diagnosis not present

## 2020-11-02 DIAGNOSIS — M7989 Other specified soft tissue disorders: Secondary | ICD-10-CM | POA: Diagnosis not present

## 2020-11-02 DIAGNOSIS — M7730 Calcaneal spur, unspecified foot: Secondary | ICD-10-CM | POA: Diagnosis not present

## 2020-11-04 DIAGNOSIS — S93422A Sprain of deltoid ligament of left ankle, initial encounter: Secondary | ICD-10-CM | POA: Diagnosis not present

## 2020-11-04 DIAGNOSIS — S93432A Sprain of tibiofibular ligament of left ankle, initial encounter: Secondary | ICD-10-CM | POA: Diagnosis not present

## 2020-11-04 DIAGNOSIS — S82302A Unspecified fracture of lower end of left tibia, initial encounter for closed fracture: Secondary | ICD-10-CM | POA: Diagnosis not present

## 2020-11-04 DIAGNOSIS — S8255XA Nondisplaced fracture of medial malleolus of left tibia, initial encounter for closed fracture: Secondary | ICD-10-CM | POA: Diagnosis not present

## 2020-11-04 DIAGNOSIS — S8262XA Displaced fracture of lateral malleolus of left fibula, initial encounter for closed fracture: Secondary | ICD-10-CM | POA: Diagnosis not present

## 2020-11-04 DIAGNOSIS — S82832A Other fracture of upper and lower end of left fibula, initial encounter for closed fracture: Secondary | ICD-10-CM | POA: Diagnosis not present

## 2020-11-04 DIAGNOSIS — X501XXA Overexertion from prolonged static or awkward postures, initial encounter: Secondary | ICD-10-CM | POA: Diagnosis not present

## 2020-11-05 DIAGNOSIS — S8262XA Displaced fracture of lateral malleolus of left fibula, initial encounter for closed fracture: Secondary | ICD-10-CM | POA: Diagnosis not present

## 2020-11-05 DIAGNOSIS — W1839XA Other fall on same level, initial encounter: Secondary | ICD-10-CM | POA: Diagnosis not present

## 2020-11-05 DIAGNOSIS — D693 Immune thrombocytopenic purpura: Secondary | ICD-10-CM | POA: Diagnosis not present

## 2020-11-05 DIAGNOSIS — S93492A Sprain of other ligament of left ankle, initial encounter: Secondary | ICD-10-CM | POA: Diagnosis not present

## 2020-11-05 DIAGNOSIS — Z01818 Encounter for other preprocedural examination: Secondary | ICD-10-CM | POA: Diagnosis not present

## 2020-11-05 DIAGNOSIS — S93422A Sprain of deltoid ligament of left ankle, initial encounter: Secondary | ICD-10-CM | POA: Diagnosis not present

## 2020-11-05 DIAGNOSIS — S82842A Displaced bimalleolar fracture of left lower leg, initial encounter for closed fracture: Secondary | ICD-10-CM | POA: Diagnosis not present

## 2020-11-05 DIAGNOSIS — Z862 Personal history of diseases of the blood and blood-forming organs and certain disorders involving the immune mechanism: Secondary | ICD-10-CM | POA: Diagnosis not present

## 2020-11-05 DIAGNOSIS — D500 Iron deficiency anemia secondary to blood loss (chronic): Secondary | ICD-10-CM | POA: Diagnosis not present

## 2020-11-07 DIAGNOSIS — Z01818 Encounter for other preprocedural examination: Secondary | ICD-10-CM | POA: Diagnosis not present

## 2020-11-09 DIAGNOSIS — Z7982 Long term (current) use of aspirin: Secondary | ICD-10-CM | POA: Diagnosis not present

## 2020-11-09 DIAGNOSIS — S93422A Sprain of deltoid ligament of left ankle, initial encounter: Secondary | ICD-10-CM | POA: Diagnosis not present

## 2020-11-09 DIAGNOSIS — D693 Immune thrombocytopenic purpura: Secondary | ICD-10-CM | POA: Diagnosis not present

## 2020-11-09 DIAGNOSIS — S93492A Sprain of other ligament of left ankle, initial encounter: Secondary | ICD-10-CM | POA: Diagnosis not present

## 2020-11-09 DIAGNOSIS — Z9081 Acquired absence of spleen: Secondary | ICD-10-CM | POA: Diagnosis not present

## 2020-11-09 DIAGNOSIS — D500 Iron deficiency anemia secondary to blood loss (chronic): Secondary | ICD-10-CM | POA: Diagnosis not present

## 2020-11-09 DIAGNOSIS — M19071 Primary osteoarthritis, right ankle and foot: Secondary | ICD-10-CM | POA: Diagnosis not present

## 2020-11-09 DIAGNOSIS — I10 Essential (primary) hypertension: Secondary | ICD-10-CM | POA: Diagnosis not present

## 2020-11-09 DIAGNOSIS — Z87891 Personal history of nicotine dependence: Secondary | ICD-10-CM | POA: Diagnosis not present

## 2020-11-09 DIAGNOSIS — G8918 Other acute postprocedural pain: Secondary | ICD-10-CM | POA: Diagnosis not present

## 2020-11-09 DIAGNOSIS — E559 Vitamin D deficiency, unspecified: Secondary | ICD-10-CM | POA: Diagnosis not present

## 2020-11-09 DIAGNOSIS — R569 Unspecified convulsions: Secondary | ICD-10-CM | POA: Diagnosis not present

## 2020-11-09 DIAGNOSIS — D689 Coagulation defect, unspecified: Secondary | ICD-10-CM | POA: Diagnosis not present

## 2020-11-09 DIAGNOSIS — S93432A Sprain of tibiofibular ligament of left ankle, initial encounter: Secondary | ICD-10-CM | POA: Diagnosis not present

## 2020-11-09 DIAGNOSIS — E538 Deficiency of other specified B group vitamins: Secondary | ICD-10-CM | POA: Diagnosis not present

## 2020-11-09 DIAGNOSIS — Z79899 Other long term (current) drug therapy: Secondary | ICD-10-CM | POA: Diagnosis not present

## 2020-11-09 DIAGNOSIS — G5632 Lesion of radial nerve, left upper limb: Secondary | ICD-10-CM | POA: Diagnosis not present

## 2020-11-09 DIAGNOSIS — S82842A Displaced bimalleolar fracture of left lower leg, initial encounter for closed fracture: Secondary | ICD-10-CM | POA: Diagnosis not present

## 2020-11-09 DIAGNOSIS — M79672 Pain in left foot: Secondary | ICD-10-CM | POA: Diagnosis not present

## 2020-11-12 DIAGNOSIS — D500 Iron deficiency anemia secondary to blood loss (chronic): Secondary | ICD-10-CM | POA: Diagnosis not present

## 2020-11-12 DIAGNOSIS — D693 Immune thrombocytopenic purpura: Secondary | ICD-10-CM | POA: Diagnosis not present

## 2020-11-13 ENCOUNTER — Other Ambulatory Visit: Payer: Self-pay | Admitting: Family Medicine

## 2020-11-13 DIAGNOSIS — M8580 Other specified disorders of bone density and structure, unspecified site: Secondary | ICD-10-CM

## 2020-11-13 DIAGNOSIS — Z9884 Bariatric surgery status: Secondary | ICD-10-CM

## 2020-11-19 DIAGNOSIS — D500 Iron deficiency anemia secondary to blood loss (chronic): Secondary | ICD-10-CM | POA: Diagnosis not present

## 2020-11-19 DIAGNOSIS — D693 Immune thrombocytopenic purpura: Secondary | ICD-10-CM | POA: Diagnosis not present

## 2020-11-26 DIAGNOSIS — D500 Iron deficiency anemia secondary to blood loss (chronic): Secondary | ICD-10-CM | POA: Diagnosis not present

## 2020-11-26 DIAGNOSIS — M19071 Primary osteoarthritis, right ankle and foot: Secondary | ICD-10-CM | POA: Diagnosis not present

## 2020-11-26 DIAGNOSIS — S93492A Sprain of other ligament of left ankle, initial encounter: Secondary | ICD-10-CM | POA: Diagnosis not present

## 2020-11-26 DIAGNOSIS — S82842A Displaced bimalleolar fracture of left lower leg, initial encounter for closed fracture: Secondary | ICD-10-CM | POA: Diagnosis not present

## 2020-11-26 DIAGNOSIS — W108XXA Fall (on) (from) other stairs and steps, initial encounter: Secondary | ICD-10-CM | POA: Diagnosis not present

## 2020-11-26 DIAGNOSIS — D693 Immune thrombocytopenic purpura: Secondary | ICD-10-CM | POA: Diagnosis not present

## 2020-11-26 DIAGNOSIS — S93422A Sprain of deltoid ligament of left ankle, initial encounter: Secondary | ICD-10-CM | POA: Diagnosis not present

## 2020-12-03 DIAGNOSIS — D693 Immune thrombocytopenic purpura: Secondary | ICD-10-CM | POA: Diagnosis not present

## 2020-12-03 DIAGNOSIS — D500 Iron deficiency anemia secondary to blood loss (chronic): Secondary | ICD-10-CM | POA: Diagnosis not present

## 2020-12-04 ENCOUNTER — Inpatient Hospital Stay: Admission: RE | Admit: 2020-12-04 | Payer: Medicare Other | Source: Ambulatory Visit

## 2020-12-04 ENCOUNTER — Ambulatory Visit
Admission: RE | Admit: 2020-12-04 | Discharge: 2020-12-04 | Disposition: A | Discharge status: Home / Self Care | Payer: Medicare Other | Source: Ambulatory Visit | Attending: Family Medicine | Admitting: Family Medicine

## 2020-12-04 ENCOUNTER — Other Ambulatory Visit: Payer: Self-pay

## 2020-12-04 DIAGNOSIS — Z9884 Bariatric surgery status: Secondary | ICD-10-CM

## 2020-12-04 DIAGNOSIS — Z13820 Encounter for screening for osteoporosis: Secondary | ICD-10-CM | POA: Diagnosis not present

## 2020-12-04 DIAGNOSIS — Z780 Asymptomatic menopausal state: Secondary | ICD-10-CM | POA: Diagnosis not present

## 2020-12-04 DIAGNOSIS — M8589 Other specified disorders of bone density and structure, multiple sites: Secondary | ICD-10-CM | POA: Insufficient documentation

## 2020-12-04 DIAGNOSIS — M8580 Other specified disorders of bone density and structure, unspecified site: Secondary | ICD-10-CM | POA: Insufficient documentation

## 2020-12-10 DIAGNOSIS — M25571 Pain in right ankle and joints of right foot: Secondary | ICD-10-CM | POA: Diagnosis not present

## 2020-12-10 DIAGNOSIS — Z5181 Encounter for therapeutic drug level monitoring: Secondary | ICD-10-CM | POA: Diagnosis not present

## 2020-12-10 DIAGNOSIS — D693 Immune thrombocytopenic purpura: Secondary | ICD-10-CM | POA: Diagnosis not present

## 2020-12-10 DIAGNOSIS — D500 Iron deficiency anemia secondary to blood loss (chronic): Secondary | ICD-10-CM | POA: Diagnosis not present

## 2020-12-10 DIAGNOSIS — G894 Chronic pain syndrome: Secondary | ICD-10-CM | POA: Diagnosis not present

## 2020-12-10 DIAGNOSIS — Z87891 Personal history of nicotine dependence: Secondary | ICD-10-CM | POA: Diagnosis not present

## 2020-12-10 DIAGNOSIS — G8929 Other chronic pain: Secondary | ICD-10-CM | POA: Diagnosis not present

## 2020-12-10 DIAGNOSIS — Z79891 Long term (current) use of opiate analgesic: Secondary | ICD-10-CM | POA: Diagnosis not present

## 2020-12-10 DIAGNOSIS — M545 Low back pain, unspecified: Secondary | ICD-10-CM | POA: Diagnosis not present

## 2020-12-10 DIAGNOSIS — G8918 Other acute postprocedural pain: Secondary | ICD-10-CM | POA: Diagnosis not present

## 2020-12-17 DIAGNOSIS — S82842D Displaced bimalleolar fracture of left lower leg, subsequent encounter for closed fracture with routine healing: Secondary | ICD-10-CM | POA: Diagnosis not present

## 2020-12-17 DIAGNOSIS — D693 Immune thrombocytopenic purpura: Secondary | ICD-10-CM | POA: Diagnosis not present

## 2020-12-17 DIAGNOSIS — S93492D Sprain of other ligament of left ankle, subsequent encounter: Secondary | ICD-10-CM | POA: Diagnosis not present

## 2020-12-17 DIAGNOSIS — W108XXD Fall (on) (from) other stairs and steps, subsequent encounter: Secondary | ICD-10-CM | POA: Diagnosis not present

## 2020-12-17 DIAGNOSIS — D500 Iron deficiency anemia secondary to blood loss (chronic): Secondary | ICD-10-CM | POA: Diagnosis not present

## 2020-12-17 DIAGNOSIS — S93422D Sprain of deltoid ligament of left ankle, subsequent encounter: Secondary | ICD-10-CM | POA: Diagnosis not present

## 2020-12-24 DIAGNOSIS — D696 Thrombocytopenia, unspecified: Secondary | ICD-10-CM | POA: Diagnosis not present

## 2020-12-24 DIAGNOSIS — D693 Immune thrombocytopenic purpura: Secondary | ICD-10-CM | POA: Diagnosis not present

## 2020-12-31 DIAGNOSIS — D500 Iron deficiency anemia secondary to blood loss (chronic): Secondary | ICD-10-CM | POA: Diagnosis not present

## 2020-12-31 DIAGNOSIS — D693 Immune thrombocytopenic purpura: Secondary | ICD-10-CM | POA: Diagnosis not present

## 2021-01-02 DIAGNOSIS — S93422D Sprain of deltoid ligament of left ankle, subsequent encounter: Secondary | ICD-10-CM | POA: Diagnosis not present

## 2021-01-02 DIAGNOSIS — S82842D Displaced bimalleolar fracture of left lower leg, subsequent encounter for closed fracture with routine healing: Secondary | ICD-10-CM | POA: Diagnosis not present

## 2021-01-02 DIAGNOSIS — S93492D Sprain of other ligament of left ankle, subsequent encounter: Secondary | ICD-10-CM | POA: Diagnosis not present

## 2021-01-02 DIAGNOSIS — W1839XD Other fall on same level, subsequent encounter: Secondary | ICD-10-CM | POA: Diagnosis not present

## 2021-01-15 DIAGNOSIS — D693 Immune thrombocytopenic purpura: Secondary | ICD-10-CM | POA: Diagnosis not present

## 2021-01-15 DIAGNOSIS — D500 Iron deficiency anemia secondary to blood loss (chronic): Secondary | ICD-10-CM | POA: Diagnosis not present

## 2021-01-27 DIAGNOSIS — Z4789 Encounter for other orthopedic aftercare: Secondary | ICD-10-CM | POA: Diagnosis not present

## 2021-01-28 DIAGNOSIS — D500 Iron deficiency anemia secondary to blood loss (chronic): Secondary | ICD-10-CM | POA: Diagnosis not present

## 2021-01-28 DIAGNOSIS — D693 Immune thrombocytopenic purpura: Secondary | ICD-10-CM | POA: Diagnosis not present

## 2021-02-04 DIAGNOSIS — D693 Immune thrombocytopenic purpura: Secondary | ICD-10-CM | POA: Diagnosis not present

## 2021-02-04 DIAGNOSIS — D500 Iron deficiency anemia secondary to blood loss (chronic): Secondary | ICD-10-CM | POA: Diagnosis not present

## 2021-02-10 DIAGNOSIS — Z4789 Encounter for other orthopedic aftercare: Secondary | ICD-10-CM | POA: Diagnosis not present

## 2021-02-10 DIAGNOSIS — D693 Immune thrombocytopenic purpura: Secondary | ICD-10-CM | POA: Diagnosis not present

## 2021-02-11 DIAGNOSIS — D500 Iron deficiency anemia secondary to blood loss (chronic): Secondary | ICD-10-CM | POA: Diagnosis not present

## 2021-02-11 DIAGNOSIS — S93492D Sprain of other ligament of left ankle, subsequent encounter: Secondary | ICD-10-CM | POA: Diagnosis not present

## 2021-02-11 DIAGNOSIS — S82842D Displaced bimalleolar fracture of left lower leg, subsequent encounter for closed fracture with routine healing: Secondary | ICD-10-CM | POA: Diagnosis not present

## 2021-02-11 DIAGNOSIS — W1839XD Other fall on same level, subsequent encounter: Secondary | ICD-10-CM | POA: Diagnosis not present

## 2021-02-11 DIAGNOSIS — D693 Immune thrombocytopenic purpura: Secondary | ICD-10-CM | POA: Diagnosis not present

## 2021-02-11 DIAGNOSIS — S93422D Sprain of deltoid ligament of left ankle, subsequent encounter: Secondary | ICD-10-CM | POA: Diagnosis not present

## 2021-02-11 DIAGNOSIS — S82842A Displaced bimalleolar fracture of left lower leg, initial encounter for closed fracture: Secondary | ICD-10-CM | POA: Diagnosis not present

## 2021-02-25 DIAGNOSIS — D693 Immune thrombocytopenic purpura: Secondary | ICD-10-CM | POA: Diagnosis not present

## 2021-03-04 DIAGNOSIS — D693 Immune thrombocytopenic purpura: Secondary | ICD-10-CM | POA: Diagnosis not present

## 2021-03-10 DIAGNOSIS — H53021 Refractive amblyopia, right eye: Secondary | ICD-10-CM | POA: Diagnosis not present

## 2021-03-10 DIAGNOSIS — H524 Presbyopia: Secondary | ICD-10-CM | POA: Diagnosis not present

## 2021-03-11 DIAGNOSIS — D693 Immune thrombocytopenic purpura: Secondary | ICD-10-CM | POA: Diagnosis not present

## 2021-03-17 DIAGNOSIS — D693 Immune thrombocytopenic purpura: Secondary | ICD-10-CM | POA: Diagnosis not present

## 2021-03-17 DIAGNOSIS — R569 Unspecified convulsions: Secondary | ICD-10-CM | POA: Diagnosis not present

## 2021-03-18 DIAGNOSIS — G894 Chronic pain syndrome: Secondary | ICD-10-CM | POA: Diagnosis not present

## 2021-03-18 DIAGNOSIS — M19171 Post-traumatic osteoarthritis, right ankle and foot: Secondary | ICD-10-CM | POA: Diagnosis not present

## 2021-03-18 DIAGNOSIS — D693 Immune thrombocytopenic purpura: Secondary | ICD-10-CM | POA: Diagnosis not present

## 2021-03-18 DIAGNOSIS — Z981 Arthrodesis status: Secondary | ICD-10-CM | POA: Diagnosis not present

## 2021-03-18 DIAGNOSIS — M25571 Pain in right ankle and joints of right foot: Secondary | ICD-10-CM | POA: Diagnosis not present

## 2021-03-18 DIAGNOSIS — Z79891 Long term (current) use of opiate analgesic: Secondary | ICD-10-CM | POA: Diagnosis not present

## 2021-03-18 DIAGNOSIS — Z5181 Encounter for therapeutic drug level monitoring: Secondary | ICD-10-CM | POA: Diagnosis not present

## 2021-03-18 DIAGNOSIS — G8929 Other chronic pain: Secondary | ICD-10-CM | POA: Diagnosis not present

## 2021-03-18 DIAGNOSIS — T8484XA Pain due to internal orthopedic prosthetic devices, implants and grafts, initial encounter: Secondary | ICD-10-CM | POA: Diagnosis not present

## 2021-03-25 DIAGNOSIS — D693 Immune thrombocytopenic purpura: Secondary | ICD-10-CM | POA: Diagnosis not present

## 2021-03-25 DIAGNOSIS — D500 Iron deficiency anemia secondary to blood loss (chronic): Secondary | ICD-10-CM | POA: Diagnosis not present

## 2021-04-08 DIAGNOSIS — D693 Immune thrombocytopenic purpura: Secondary | ICD-10-CM | POA: Diagnosis not present

## 2021-04-08 DIAGNOSIS — D500 Iron deficiency anemia secondary to blood loss (chronic): Secondary | ICD-10-CM | POA: Diagnosis not present

## 2021-04-08 DIAGNOSIS — Z79899 Other long term (current) drug therapy: Secondary | ICD-10-CM | POA: Diagnosis not present

## 2021-04-15 DIAGNOSIS — D500 Iron deficiency anemia secondary to blood loss (chronic): Secondary | ICD-10-CM | POA: Diagnosis not present

## 2021-04-15 DIAGNOSIS — D693 Immune thrombocytopenic purpura: Secondary | ICD-10-CM | POA: Diagnosis not present

## 2021-04-19 IMAGING — CT CT HEAD W/O CM
3 series · 16 of 47 positions shown, 19 images · non-contrast
Comparison: None.

CLINICAL DATA: Head trauma.  Coagulopathy.  Pain.

EXAM:
CT HEAD WITHOUT CONTRAST
TECHNIQUE: Contiguous axial images were obtained from the base of the skull
through the vertex without intravenous contrast.

[Series 2: head w o · axial · 0.42mm/px · z∈[+30,+160]mm · 10 of 32 slices shown, 13 images]
[im 3/32  brain]
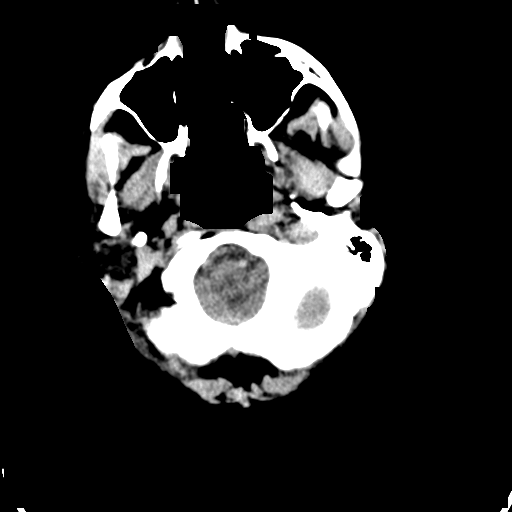
[im 3/32  bone]
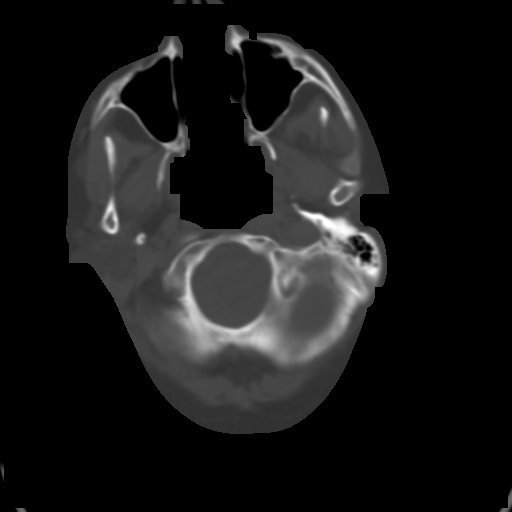
[im 6/32  brain]
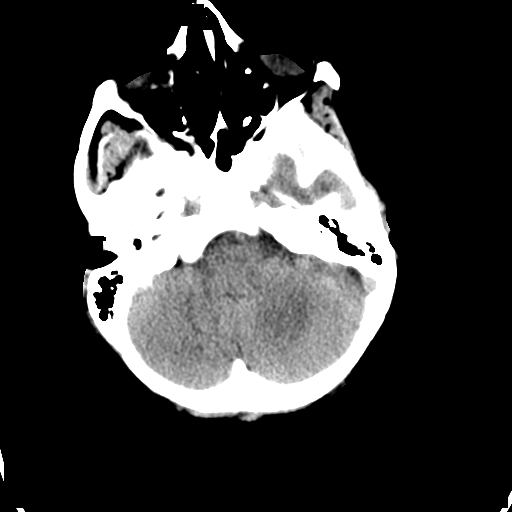
[im 9/32  brain]
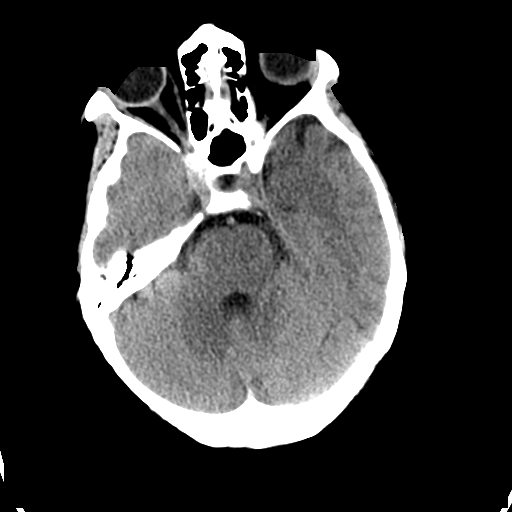
[im 11/32  brain]
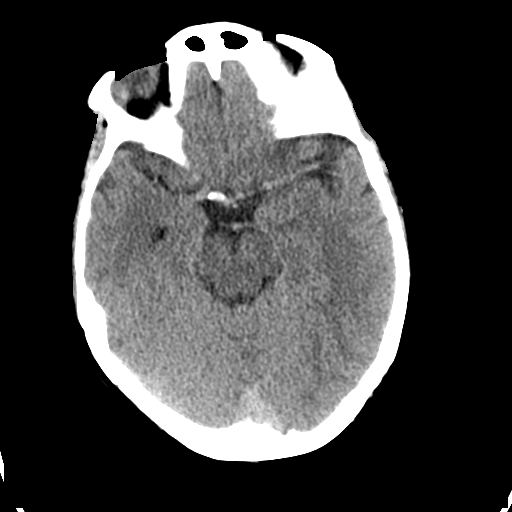
[im 14/32  brain]
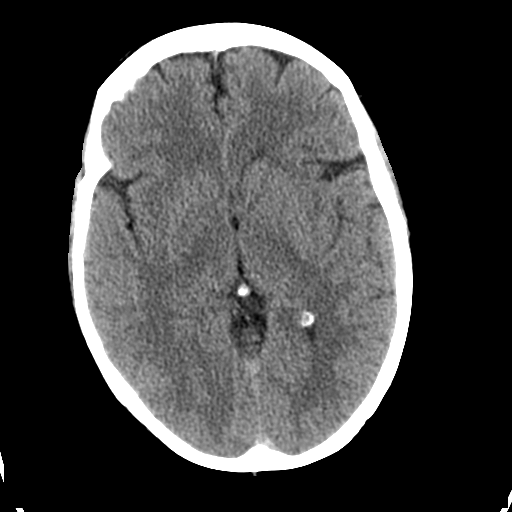
[im 14/32  bone]
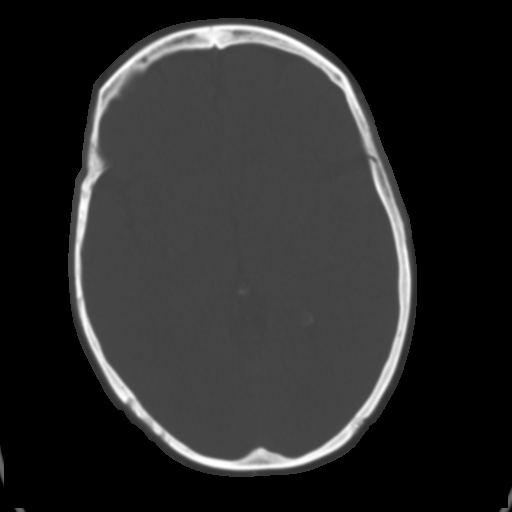
[im 18/32  brain]
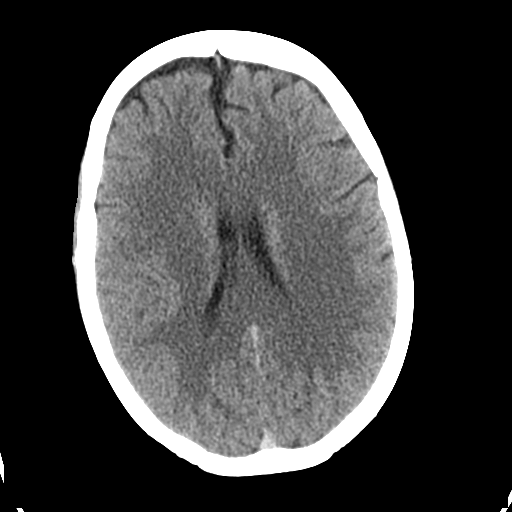
[im 21/32  brain]
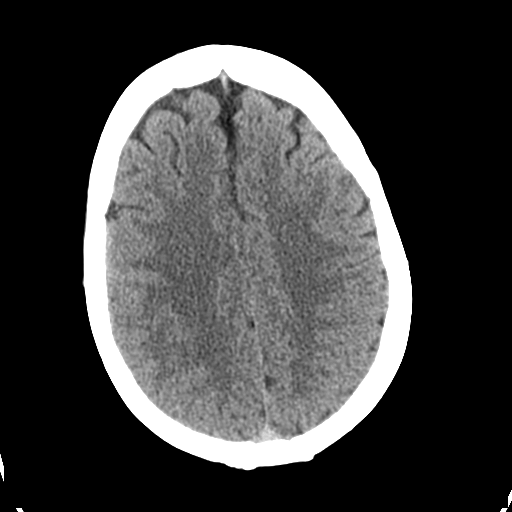
[im 24/32  brain]
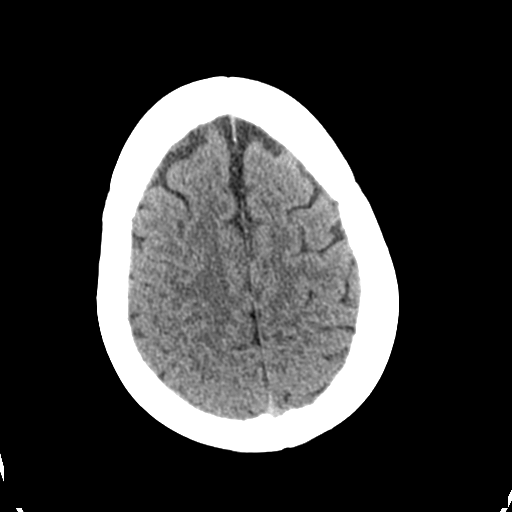
[im 26/32  brain]
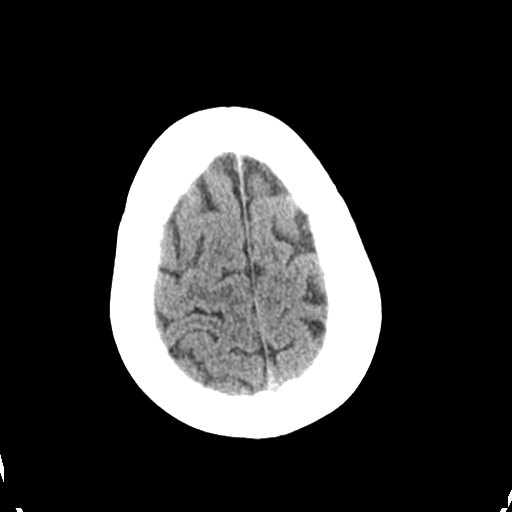
[im 26/32  bone]
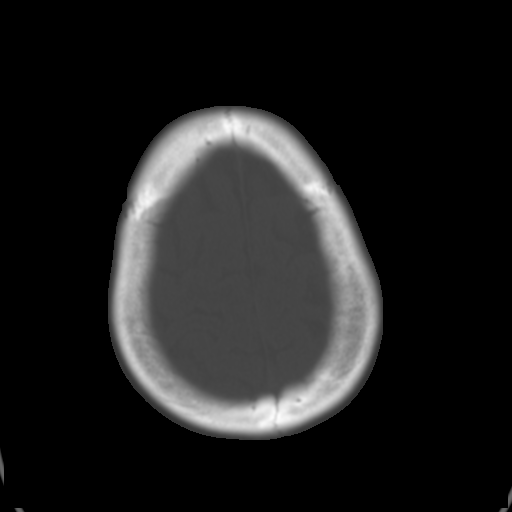
[im 29/32  brain]
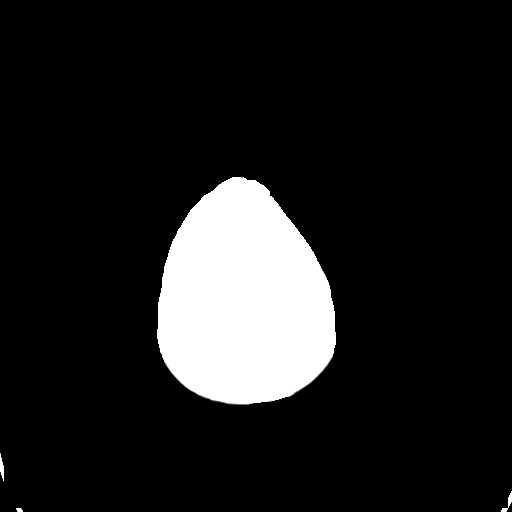

[Series 4: coronal soft · coronal · 0.32mm/px · 3 of 67 slices shown]
[im 23/67  brain]
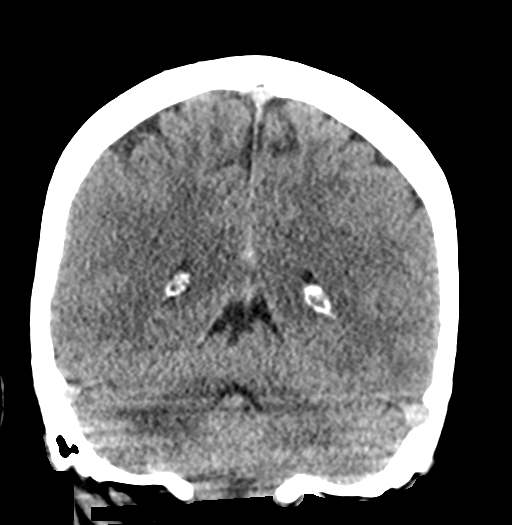
[im 30/67  brain]
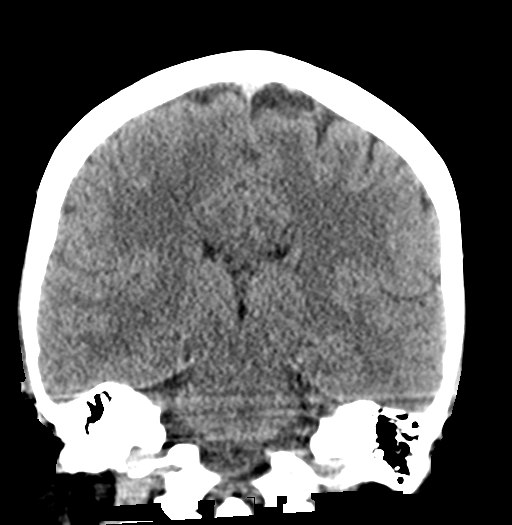
[im 37/67  brain]
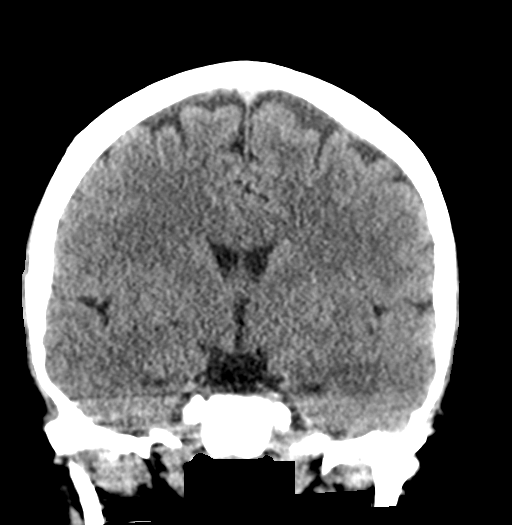

[Series 5: sagittal soft · sagittal · 0.36mm/px · 3 of 53 slices shown]
[im 18/53  brain]
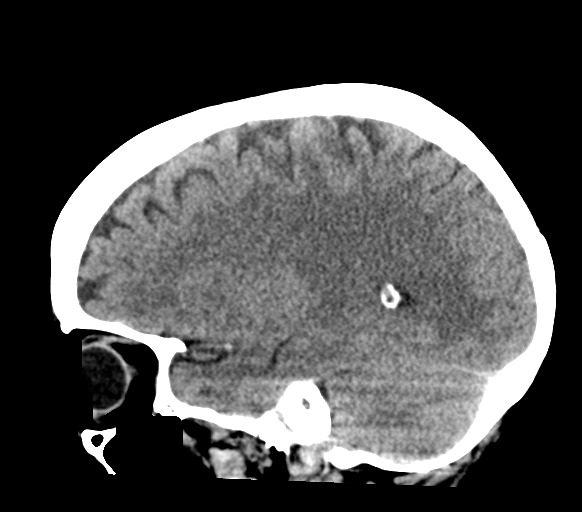
[im 27/53  brain]
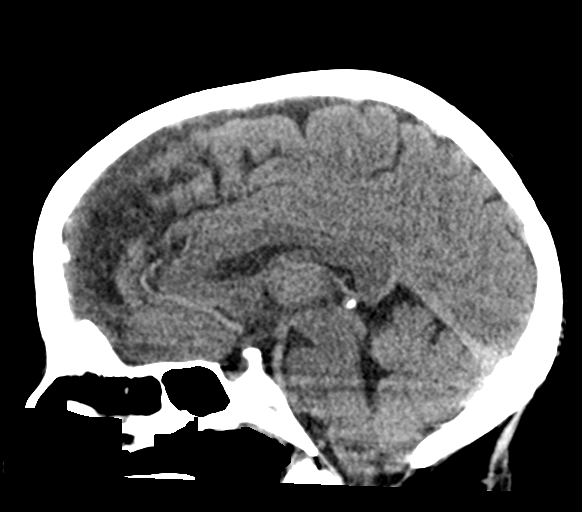
[im 35/53  brain]
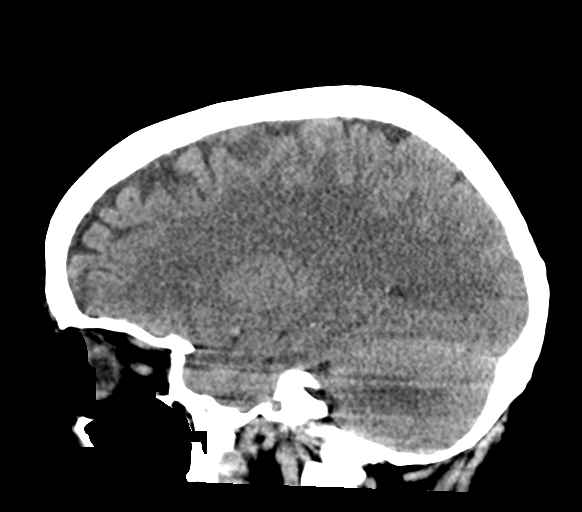

[16 of 47 positions shown; findings below may reference images not displayed]

FINDINGS: Brain: No evidence of acute infarction, hemorrhage, hydrocephalus,
extra-axial collection or mass lesion/mass effect.

Vascular: No hyperdense vessel or unexpected calcification.

Skull: Normal. Negative for fracture or focal lesion.

Sinuses/Orbits: No acute finding.

Other: None.
IMPRESSION: No acute intracranial abnormalities identified.

## 2021-04-23 DIAGNOSIS — M19171 Post-traumatic osteoarthritis, right ankle and foot: Secondary | ICD-10-CM | POA: Diagnosis not present

## 2021-04-23 DIAGNOSIS — Z981 Arthrodesis status: Secondary | ICD-10-CM | POA: Diagnosis not present

## 2021-04-23 DIAGNOSIS — M25571 Pain in right ankle and joints of right foot: Secondary | ICD-10-CM | POA: Diagnosis not present

## 2021-04-23 DIAGNOSIS — Z5181 Encounter for therapeutic drug level monitoring: Secondary | ICD-10-CM | POA: Diagnosis not present

## 2021-04-23 DIAGNOSIS — G894 Chronic pain syndrome: Secondary | ICD-10-CM | POA: Diagnosis not present

## 2021-04-23 DIAGNOSIS — G8929 Other chronic pain: Secondary | ICD-10-CM | POA: Diagnosis not present

## 2021-04-23 DIAGNOSIS — Z79891 Long term (current) use of opiate analgesic: Secondary | ICD-10-CM | POA: Diagnosis not present

## 2021-04-29 DIAGNOSIS — D693 Immune thrombocytopenic purpura: Secondary | ICD-10-CM | POA: Diagnosis not present

## 2021-05-06 DIAGNOSIS — D693 Immune thrombocytopenic purpura: Secondary | ICD-10-CM | POA: Diagnosis not present

## 2021-05-13 DIAGNOSIS — G5731 Lesion of lateral popliteal nerve, right lower limb: Secondary | ICD-10-CM | POA: Diagnosis not present

## 2021-05-13 DIAGNOSIS — D693 Immune thrombocytopenic purpura: Secondary | ICD-10-CM | POA: Diagnosis not present

## 2021-05-13 DIAGNOSIS — G8929 Other chronic pain: Secondary | ICD-10-CM | POA: Diagnosis not present

## 2021-05-20 DIAGNOSIS — D693 Immune thrombocytopenic purpura: Secondary | ICD-10-CM | POA: Diagnosis not present

## 2021-05-21 DIAGNOSIS — D693 Immune thrombocytopenic purpura: Secondary | ICD-10-CM | POA: Diagnosis not present

## 2021-05-21 DIAGNOSIS — D500 Iron deficiency anemia secondary to blood loss (chronic): Secondary | ICD-10-CM | POA: Diagnosis not present

## 2021-05-22 DIAGNOSIS — H8110 Benign paroxysmal vertigo, unspecified ear: Secondary | ICD-10-CM | POA: Diagnosis not present

## 2021-05-22 DIAGNOSIS — R519 Headache, unspecified: Secondary | ICD-10-CM | POA: Diagnosis not present

## 2021-05-26 DIAGNOSIS — Z23 Encounter for immunization: Secondary | ICD-10-CM | POA: Diagnosis not present

## 2021-05-26 DIAGNOSIS — Z0000 Encounter for general adult medical examination without abnormal findings: Secondary | ICD-10-CM | POA: Diagnosis not present

## 2021-05-26 DIAGNOSIS — Z1231 Encounter for screening mammogram for malignant neoplasm of breast: Secondary | ICD-10-CM | POA: Diagnosis not present

## 2021-05-26 DIAGNOSIS — D693 Immune thrombocytopenic purpura: Secondary | ICD-10-CM | POA: Diagnosis not present

## 2021-05-26 DIAGNOSIS — Z13220 Encounter for screening for lipoid disorders: Secondary | ICD-10-CM | POA: Diagnosis not present

## 2021-05-29 DIAGNOSIS — M9904 Segmental and somatic dysfunction of sacral region: Secondary | ICD-10-CM | POA: Diagnosis not present

## 2021-05-29 DIAGNOSIS — M5413 Radiculopathy, cervicothoracic region: Secondary | ICD-10-CM | POA: Diagnosis not present

## 2021-05-29 DIAGNOSIS — G44209 Tension-type headache, unspecified, not intractable: Secondary | ICD-10-CM | POA: Diagnosis not present

## 2021-05-29 DIAGNOSIS — M5451 Vertebrogenic low back pain: Secondary | ICD-10-CM | POA: Diagnosis not present

## 2021-05-29 DIAGNOSIS — M9901 Segmental and somatic dysfunction of cervical region: Secondary | ICD-10-CM | POA: Diagnosis not present

## 2021-05-29 DIAGNOSIS — M461 Sacroiliitis, not elsewhere classified: Secondary | ICD-10-CM | POA: Diagnosis not present

## 2021-05-29 DIAGNOSIS — M9903 Segmental and somatic dysfunction of lumbar region: Secondary | ICD-10-CM | POA: Diagnosis not present

## 2021-06-03 DIAGNOSIS — D693 Immune thrombocytopenic purpura: Secondary | ICD-10-CM | POA: Diagnosis not present

## 2021-06-10 DIAGNOSIS — D693 Immune thrombocytopenic purpura: Secondary | ICD-10-CM | POA: Diagnosis not present

## 2021-06-13 DIAGNOSIS — T8484XA Pain due to internal orthopedic prosthetic devices, implants and grafts, initial encounter: Secondary | ICD-10-CM | POA: Diagnosis not present

## 2021-06-13 DIAGNOSIS — M40299 Other kyphosis, site unspecified: Secondary | ICD-10-CM | POA: Diagnosis not present

## 2021-06-24 DIAGNOSIS — Z13220 Encounter for screening for lipoid disorders: Secondary | ICD-10-CM | POA: Diagnosis not present

## 2021-06-24 DIAGNOSIS — D693 Immune thrombocytopenic purpura: Secondary | ICD-10-CM | POA: Diagnosis not present

## 2021-06-24 DIAGNOSIS — Z862 Personal history of diseases of the blood and blood-forming organs and certain disorders involving the immune mechanism: Secondary | ICD-10-CM | POA: Diagnosis not present

## 2021-06-25 DIAGNOSIS — M25571 Pain in right ankle and joints of right foot: Secondary | ICD-10-CM | POA: Diagnosis not present

## 2021-06-25 DIAGNOSIS — G8929 Other chronic pain: Secondary | ICD-10-CM | POA: Diagnosis not present

## 2021-06-25 DIAGNOSIS — Z87891 Personal history of nicotine dependence: Secondary | ICD-10-CM | POA: Diagnosis not present

## 2021-06-25 DIAGNOSIS — Z79899 Other long term (current) drug therapy: Secondary | ICD-10-CM | POA: Diagnosis not present

## 2021-07-01 DIAGNOSIS — Z862 Personal history of diseases of the blood and blood-forming organs and certain disorders involving the immune mechanism: Secondary | ICD-10-CM | POA: Diagnosis not present

## 2021-07-01 DIAGNOSIS — D693 Immune thrombocytopenic purpura: Secondary | ICD-10-CM | POA: Diagnosis not present

## 2021-07-08 DIAGNOSIS — D693 Immune thrombocytopenic purpura: Secondary | ICD-10-CM | POA: Diagnosis not present

## 2021-07-15 DIAGNOSIS — D693 Immune thrombocytopenic purpura: Secondary | ICD-10-CM | POA: Diagnosis not present

## 2021-07-21 DIAGNOSIS — G894 Chronic pain syndrome: Secondary | ICD-10-CM | POA: Diagnosis not present

## 2021-07-21 DIAGNOSIS — Z79891 Long term (current) use of opiate analgesic: Secondary | ICD-10-CM | POA: Diagnosis not present

## 2021-07-21 DIAGNOSIS — Z862 Personal history of diseases of the blood and blood-forming organs and certain disorders involving the immune mechanism: Secondary | ICD-10-CM | POA: Diagnosis not present

## 2021-07-21 DIAGNOSIS — G8929 Other chronic pain: Secondary | ICD-10-CM | POA: Diagnosis not present

## 2021-07-21 DIAGNOSIS — M25571 Pain in right ankle and joints of right foot: Secondary | ICD-10-CM | POA: Diagnosis not present

## 2022-07-25 ENCOUNTER — Telehealth: Payer: 59 | Admitting: Nurse Practitioner

## 2022-07-25 DIAGNOSIS — J40 Bronchitis, not specified as acute or chronic: Secondary | ICD-10-CM

## 2022-07-25 MED ORDER — AZITHROMYCIN 250 MG PO TABS: ORAL_TABLET | ORAL | 0 refills | Status: AC

## 2022-07-25 NOTE — Patient Instructions (Signed)
Leslie [name], thank you for joining [name], NP for today's virtual visit.  While this provider is not your primary care provider (PCP), if your PCP is located in our provider database this encounter information will be shared with them immediately following your visit.   [place] account gives you access to today's visit and all your visits, tests, and labs performed at [facility] " click here if you don't have a [facility] account or go to [email or web address]  Consent: (Patient) Leslie [name] provided verbal consent for this virtual visit at the beginning of the encounter.  Current Medications:  Current Outpatient Medications:    azithromycin (ZITHROMAX) 250 MG tablet, Take 2 tablets on day 1, then 1 tablet daily on days 2 through 5, Disp: 6 tablet, Rfl: 0   acetaminophen (TYLENOL) 325 MG tablet, Take 325 mg by mouth every 12 (twelve) hours. Take 2 tablets twice a day, Disp: , Rfl:    buprenorphine (SUBUTEX) 2 MG SUBL SL tablet, Place 2 mg under the tongue daily., Disp: , Rfl:    chlordiazePOXIDE (LIBRIUM) 10 MG capsule, Take 3 tablets by mouth daily x2 days; then take 2 tablets by mouth daily x3 days; then take 1 tablet by mouth daily x3 days; then take half tablet by mouth daily x3 days and is still bleeding., Disp: 18 capsule, Rfl: 0   cloBAZam (ONFI) 10 MG tablet, Take 20 mg by mouth. Take 20 mg at night, Disp: , Rfl:    cyanocobalamin (,VITAMIN B-12,) 1000 MCG/ML injection, Inject 1 mL daily for 7 days; then weekly for 1 month and then monthly after that until follow-up with PCP and further instructions provided., Disp: 12 mL, Rfl: 0   EPINEPHrine 0.3 mg/0.3 mL IJ SOAJ injection, Inject into the muscle., Disp: , Rfl:    fostamatinib disodium (TAVALISSE) 100 MG tablet, Take 100 mg by mouth 2 (two) times daily., Disp: , Rfl:    gabapentin (NEURONTIN) 300 MG capsule, Take by mouth. Take 300mg  in the morning, take 300mg  in the afternoon,  and take 600 mg at night, Disp: , Rfl:    magnesium oxide (MAG-OX) 400 MG tablet, Take 1 tablet (400 mg total) by mouth daily., Disp: 30 tablet, Rfl: 1   naloxone (NARCAN) nasal spray 4 mg/0.1 mL, One spray in nostril if patient is not breathing; call 911; if needed repeat after 2 minutes, Disp: , Rfl:    NAYZILAM 5 MG/0.1ML SOLN, Administer as needed, Disp: , Rfl:    norelgestromin-ethinyl estradiol (ORTHO EVRA) 150-35 MCG/24HR transdermal patch, Place 1 patch onto the skin once a week., Disp: , Rfl:    ondansetron (ZOFRAN-ODT) 4 MG disintegrating tablet, Take 4 mg by mouth daily as needed., Disp: , Rfl:    oxyCODONE (OXY IR/ROXICODONE) 5 MG immediate release tablet, Take 5 mg by mouth 3 (three) times daily as needed., Disp: , Rfl:    polyethylene glycol (MIRALAX / GLYCOLAX) 17 g packet, Take 17 g by mouth daily as needed., Disp: , Rfl:    potassium chloride (KLOR-CON) 20 MEQ packet, Take 20 mEq by mouth daily., Disp: , Rfl:    Pramoxine-HC (HYDROCORTISONE ACE-PRAMOXINE) 2.5-1 % CREA, Apply three times daily as needed, Disp: , Rfl:    PROMACTA 50 MG tablet, Take 100 mg by mouth daily. (Patient not taking: No sig reported), Disp: , Rfl:    saccharomyces boulardii (FLORASTOR) 250 MG capsule, Take 1 capsule (250 mg total) by mouth 2 (two) times daily., Disp:  30 capsule, Rfl: 0   topiramate (TOPAMAX) 200 MG tablet, Take 200 mg by mouth 2 (two) times daily., Disp: , Rfl:    TRINTELLIX 10 MG TABS tablet, TAKE 1 AND 1/2 TABLETS BY MOUTH EVERY DAY (Patient not taking: Reported on 08/26/2020), Disp: 45 tablet, Rfl: 0   vitamin B-12 (CYANOCOBALAMIN) 1000 MCG tablet, Take 1,000 mcg by mouth daily., Disp: , Rfl:    Medications ordered in this encounter:  Meds ordered this encounter  Medications   azithromycin (ZITHROMAX) 250 MG tablet    Sig: Take 2 tablets on day 1, then 1 tablet daily on days 2 through 5    Dispense:  6 tablet    Refill:  0    Order Specific Question:   Supervising Provider    Answer:    Chase Picket [[id number]]     *If you need refills on other medications prior to your next appointment, please contact your pharmacy*  Follow-Up: Call back or seek an in-person evaluation if the symptoms worsen or if the condition fails to improve as anticipated.  [facility] [phone]  Other Instructions INSTRUCTIONS: use a humidifier for nasal congestion Drink plenty of fluids, rest and wash hands frequently to avoid the spread of infection Alternate tylenol and Motrin for relief of fever    If you have been instructed to have an in-person evaluation today at a local Urgent Care facility, please use the link below. It will take you to a list of all of our available [facility], including address, phone number and hours of operation. Please do not delay care.  [facility]  If you or a family member do not have a primary care provider, use the link below to schedule a visit and establish care. When you choose a [facility] primary care physician or advanced practice provider, you gain a long-term partner in health. Find a Primary Care Provider  Learn more about 's in-office and virtual care options: [facility]
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
## 2022-12-02 ENCOUNTER — Ambulatory Visit
Admission: EM | Admit: 2022-12-02 | Discharge: 2022-12-02 | Disposition: A | Discharge status: Home / Self Care | Payer: 59

## 2022-12-02 ENCOUNTER — Other Ambulatory Visit: Payer: Self-pay

## 2022-12-02 DIAGNOSIS — R7989 Other specified abnormal findings of blood chemistry: Secondary | ICD-10-CM

## 2022-12-02 DIAGNOSIS — M79604 Pain in right leg: Secondary | ICD-10-CM

## 2022-12-02 DIAGNOSIS — M7989 Other specified soft tissue disorders: Secondary | ICD-10-CM

## 2022-12-02 DIAGNOSIS — D693 Immune thrombocytopenic purpura: Secondary | ICD-10-CM

## 2022-12-02 HISTORY — DX: Obesity, unspecified: E66.9

## 2022-12-02 NOTE — ED Notes (Signed)
Patient is being discharged from the Urgent Care and sent to the Emergency Department via POV with mom. Per Michiel Cowboy, [place], patient is in need of higher level of care to rule out DVT. Patient is aware and verbalizes understanding of plan of care.  Vitals:   12/02/22 1707  BP: (!) 147/96  Pulse: (!) 18  Resp: 19  Temp: 98.4 F (36.9 C)  SpO2: 97%

## 2022-12-02 NOTE — ED Triage Notes (Signed)
Pt is here with right leg swelling that started 4 days ago, pt has not taken any meds to relieve discomfort.

## 2022-12-02 NOTE — Discharge Instructions (Signed)
-  Very high concern for acute DVT or blood clot in your lower extremity.  Please head to the emergency department immediately.  Please do not delay or your condition could worsen as we discussed.  You have been advised to follow up immediately in the emergency department for concerning signs.symptoms. If you declined EMS transport, please have a family member take you directly to the ED at this time. Do not delay. Based on concerns about condition, if you do not follow up in th e ED, you may risk poor outcomes including worsening of condition, delayed treatment and potentially life threatening issues. If you have declined to go to the ED at this time, you should call your PCP immediately to set up a follow up appointment.  Go to ED for red flag symptoms, including; fevers you cannot reduce with Tylenol/Motrin, severe headaches, vision changes, numbness/weakness in part of the body, lethargy, confusion, intractable vomiting, severe dehydration, chest pain, breathing difficulty, severe persistent abdominal or pelvic pain, signs of severe infection (increased redness, swelling of an area), feeling faint or passing out, dizziness, etc. You should especially go to the ED for sudden acute worsening of condition if you do not elect to go at this time.
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
## 2022-12-28 DIAGNOSIS — G894 Chronic pain syndrome: Secondary | ICD-10-CM | POA: Diagnosis not present

## 2022-12-28 DIAGNOSIS — Z79891 Long term (current) use of opiate analgesic: Secondary | ICD-10-CM | POA: Diagnosis not present

## 2022-12-28 DIAGNOSIS — M25571 Pain in right ankle and joints of right foot: Secondary | ICD-10-CM | POA: Diagnosis not present

## 2022-12-28 DIAGNOSIS — G8929 Other chronic pain: Secondary | ICD-10-CM | POA: Diagnosis not present

## 2022-12-29 DIAGNOSIS — R600 Localized edema: Secondary | ICD-10-CM | POA: Diagnosis not present

## 2022-12-29 DIAGNOSIS — I82411 Acute embolism and thrombosis of right femoral vein: Secondary | ICD-10-CM | POA: Diagnosis not present

## 2022-12-29 DIAGNOSIS — I890 Lymphedema, not elsewhere classified: Secondary | ICD-10-CM | POA: Diagnosis not present

## 2022-12-29 DIAGNOSIS — D693 Immune thrombocytopenic purpura: Secondary | ICD-10-CM | POA: Diagnosis not present

## 2023-01-04 ENCOUNTER — Encounter: Payer: Self-pay | Admitting: Nurse Practitioner

## 2023-01-04 ENCOUNTER — Ambulatory Visit: Payer: 59

## 2023-01-04 ENCOUNTER — Telehealth: Payer: Medicare HMO | Admitting: Nurse Practitioner

## 2023-01-04 DIAGNOSIS — B9789 Other viral agents as the cause of diseases classified elsewhere: Secondary | ICD-10-CM | POA: Diagnosis not present

## 2023-01-04 DIAGNOSIS — J0140 Acute pansinusitis, unspecified: Secondary | ICD-10-CM | POA: Diagnosis not present

## 2023-01-04 DIAGNOSIS — H109 Unspecified conjunctivitis: Secondary | ICD-10-CM

## 2023-01-04 MED ORDER — POLYMYXIN B-TRIMETHOPRIM 10000-0.1 UNIT/ML-% OP SOLN
1.0000 [drp] | OPHTHALMIC | 0 refills | Status: AC
Start: 2023-01-04 — End: 2023-01-09

## 2023-01-04 MED ORDER — IPRATROPIUM BROMIDE 0.03 % NA SOLN
2.0000 | Freq: Two times a day (BID) | NASAL | 12 refills | Status: DC
Start: 2023-01-04 — End: 2023-01-04

## 2023-01-04 MED ORDER — IPRATROPIUM BROMIDE 0.03 % NA SOLN
2.0000 | Freq: Two times a day (BID) | NASAL | 12 refills | Status: AC
Start: 2023-01-04 — End: ?
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
## 2023-01-06 DIAGNOSIS — J069 Acute upper respiratory infection, unspecified: Secondary | ICD-10-CM | POA: Diagnosis not present

## 2023-01-06 DIAGNOSIS — R635 Abnormal weight gain: Secondary | ICD-10-CM | POA: Diagnosis not present

## 2023-01-06 DIAGNOSIS — R600 Localized edema: Secondary | ICD-10-CM | POA: Diagnosis not present

## 2023-01-06 DIAGNOSIS — D693 Immune thrombocytopenic purpura: Secondary | ICD-10-CM | POA: Diagnosis not present

## 2023-01-07 DIAGNOSIS — Z86718 Personal history of other venous thrombosis and embolism: Secondary | ICD-10-CM | POA: Diagnosis not present

## 2023-01-07 DIAGNOSIS — R0609 Other forms of dyspnea: Secondary | ICD-10-CM | POA: Diagnosis not present

## 2023-01-07 DIAGNOSIS — E8809 Other disorders of plasma-protein metabolism, not elsewhere classified: Secondary | ICD-10-CM | POA: Diagnosis not present

## 2023-01-07 DIAGNOSIS — E8770 Fluid overload, unspecified: Secondary | ICD-10-CM | POA: Diagnosis not present

## 2023-01-07 DIAGNOSIS — K760 Fatty (change of) liver, not elsewhere classified: Secondary | ICD-10-CM | POA: Diagnosis not present

## 2023-01-07 DIAGNOSIS — I10 Essential (primary) hypertension: Secondary | ICD-10-CM | POA: Diagnosis not present

## 2023-01-07 DIAGNOSIS — I82413 Acute embolism and thrombosis of femoral vein, bilateral: Secondary | ICD-10-CM | POA: Diagnosis not present

## 2023-01-07 DIAGNOSIS — Z9884 Bariatric surgery status: Secondary | ICD-10-CM | POA: Diagnosis not present

## 2023-01-07 DIAGNOSIS — F1021 Alcohol dependence, in remission: Secondary | ICD-10-CM | POA: Diagnosis not present

## 2023-01-07 DIAGNOSIS — G40909 Epilepsy, unspecified, not intractable, without status epilepticus: Secondary | ICD-10-CM | POA: Diagnosis not present

## 2023-01-07 DIAGNOSIS — R899 Unspecified abnormal finding in specimens from other organs, systems and tissues: Secondary | ICD-10-CM | POA: Diagnosis not present

## 2023-01-07 DIAGNOSIS — R0600 Dyspnea, unspecified: Secondary | ICD-10-CM | POA: Diagnosis not present

## 2023-01-07 DIAGNOSIS — F1111 Opioid abuse, in remission: Secondary | ICD-10-CM | POA: Diagnosis not present

## 2023-01-07 DIAGNOSIS — D693 Immune thrombocytopenic purpura: Secondary | ICD-10-CM | POA: Diagnosis not present

## 2023-01-07 DIAGNOSIS — K838 Other specified diseases of biliary tract: Secondary | ICD-10-CM | POA: Diagnosis not present

## 2023-01-07 DIAGNOSIS — Z7901 Long term (current) use of anticoagulants: Secondary | ICD-10-CM | POA: Diagnosis not present

## 2023-01-07 DIAGNOSIS — R600 Localized edema: Secondary | ICD-10-CM | POA: Diagnosis not present

## 2023-01-07 DIAGNOSIS — F39 Unspecified mood [affective] disorder: Secondary | ICD-10-CM | POA: Diagnosis not present

## 2023-01-07 DIAGNOSIS — I272 Pulmonary hypertension, unspecified: Secondary | ICD-10-CM | POA: Diagnosis not present

## 2023-01-07 DIAGNOSIS — I82433 Acute embolism and thrombosis of popliteal vein, bilateral: Secondary | ICD-10-CM | POA: Diagnosis not present

## 2023-01-18 DIAGNOSIS — D693 Immune thrombocytopenic purpura: Secondary | ICD-10-CM | POA: Diagnosis not present

## 2023-01-25 DIAGNOSIS — R609 Edema, unspecified: Secondary | ICD-10-CM | POA: Diagnosis not present

## 2023-01-25 DIAGNOSIS — E559 Vitamin D deficiency, unspecified: Secondary | ICD-10-CM | POA: Diagnosis not present

## 2023-01-25 DIAGNOSIS — G40409 Other generalized epilepsy and epileptic syndromes, not intractable, without status epilepticus: Secondary | ICD-10-CM | POA: Diagnosis not present

## 2023-01-25 DIAGNOSIS — R0602 Shortness of breath: Secondary | ICD-10-CM | POA: Diagnosis not present

## 2023-01-25 DIAGNOSIS — I82411 Acute embolism and thrombosis of right femoral vein: Secondary | ICD-10-CM | POA: Diagnosis not present

## 2023-01-25 DIAGNOSIS — E8809 Other disorders of plasma-protein metabolism, not elsewhere classified: Secondary | ICD-10-CM | POA: Diagnosis not present

## 2023-01-25 DIAGNOSIS — E786 Lipoprotein deficiency: Secondary | ICD-10-CM | POA: Diagnosis not present

## 2023-01-25 DIAGNOSIS — R0901 Asphyxia: Secondary | ICD-10-CM | POA: Diagnosis not present

## 2023-01-25 DIAGNOSIS — Z9884 Bariatric surgery status: Secondary | ICD-10-CM | POA: Diagnosis not present

## 2023-01-25 DIAGNOSIS — E872 Acidosis, unspecified: Secondary | ICD-10-CM | POA: Diagnosis not present

## 2023-01-25 DIAGNOSIS — D689 Coagulation defect, unspecified: Secondary | ICD-10-CM | POA: Diagnosis not present

## 2023-01-25 DIAGNOSIS — E876 Hypokalemia: Secondary | ICD-10-CM | POA: Diagnosis not present

## 2023-01-25 DIAGNOSIS — E8779 Other fluid overload: Secondary | ICD-10-CM | POA: Diagnosis not present

## 2023-01-25 DIAGNOSIS — Z86718 Personal history of other venous thrombosis and embolism: Secondary | ICD-10-CM | POA: Diagnosis not present

## 2023-01-25 DIAGNOSIS — R1310 Dysphagia, unspecified: Secondary | ICD-10-CM | POA: Diagnosis not present

## 2023-01-25 DIAGNOSIS — E8770 Fluid overload, unspecified: Secondary | ICD-10-CM | POA: Diagnosis not present

## 2023-01-25 DIAGNOSIS — G9341 Metabolic encephalopathy: Secondary | ICD-10-CM | POA: Diagnosis not present

## 2023-01-25 DIAGNOSIS — E610 Copper deficiency: Secondary | ICD-10-CM | POA: Diagnosis not present

## 2023-01-25 DIAGNOSIS — G40909 Epilepsy, unspecified, not intractable, without status epilepticus: Secondary | ICD-10-CM | POA: Diagnosis not present

## 2023-01-25 DIAGNOSIS — I890 Lymphedema, not elsewhere classified: Secondary | ICD-10-CM | POA: Diagnosis not present

## 2023-01-25 DIAGNOSIS — E878 Other disorders of electrolyte and fluid balance, not elsewhere classified: Secondary | ICD-10-CM | POA: Diagnosis not present

## 2023-01-25 DIAGNOSIS — R194 Change in bowel habit: Secondary | ICD-10-CM | POA: Diagnosis not present

## 2023-01-25 DIAGNOSIS — R791 Abnormal coagulation profile: Secondary | ICD-10-CM | POA: Diagnosis not present

## 2023-01-25 DIAGNOSIS — R1312 Dysphagia, oropharyngeal phase: Secondary | ICD-10-CM | POA: Diagnosis not present

## 2023-01-25 DIAGNOSIS — R4182 Altered mental status, unspecified: Secondary | ICD-10-CM | POA: Diagnosis not present

## 2023-01-25 DIAGNOSIS — G9340 Encephalopathy, unspecified: Secondary | ICD-10-CM | POA: Diagnosis not present

## 2023-01-25 DIAGNOSIS — D693 Immune thrombocytopenic purpura: Secondary | ICD-10-CM | POA: Diagnosis not present

## 2023-01-25 DIAGNOSIS — F1021 Alcohol dependence, in remission: Secondary | ICD-10-CM | POA: Diagnosis not present

## 2023-01-25 DIAGNOSIS — R29818 Other symptoms and signs involving the nervous system: Secondary | ICD-10-CM | POA: Diagnosis not present

## 2023-01-25 DIAGNOSIS — I82413 Acute embolism and thrombosis of femoral vein, bilateral: Secondary | ICD-10-CM | POA: Diagnosis not present

## 2023-01-25 DIAGNOSIS — R633 Feeding difficulties, unspecified: Secondary | ICD-10-CM | POA: Diagnosis not present

## 2023-01-27 DIAGNOSIS — F1021 Alcohol dependence, in remission: Secondary | ICD-10-CM | POA: Diagnosis not present

## 2023-01-27 DIAGNOSIS — E878 Other disorders of electrolyte and fluid balance, not elsewhere classified: Secondary | ICD-10-CM | POA: Diagnosis not present

## 2023-01-27 DIAGNOSIS — G9340 Encephalopathy, unspecified: Secondary | ICD-10-CM | POA: Diagnosis not present

## 2023-01-27 DIAGNOSIS — E876 Hypokalemia: Secondary | ICD-10-CM | POA: Diagnosis not present

## 2023-01-27 DIAGNOSIS — E8770 Fluid overload, unspecified: Secondary | ICD-10-CM | POA: Diagnosis not present

## 2023-01-27 DIAGNOSIS — E872 Acidosis, unspecified: Secondary | ICD-10-CM | POA: Diagnosis not present

## 2023-01-28 DIAGNOSIS — F1021 Alcohol dependence, in remission: Secondary | ICD-10-CM | POA: Diagnosis not present

## 2023-01-28 DIAGNOSIS — G9340 Encephalopathy, unspecified: Secondary | ICD-10-CM | POA: Diagnosis not present

## 2023-01-28 DIAGNOSIS — E876 Hypokalemia: Secondary | ICD-10-CM | POA: Diagnosis not present

## 2023-01-28 DIAGNOSIS — E8770 Fluid overload, unspecified: Secondary | ICD-10-CM | POA: Diagnosis not present

## 2023-01-28 DIAGNOSIS — E872 Acidosis, unspecified: Secondary | ICD-10-CM | POA: Diagnosis not present

## 2023-01-28 DIAGNOSIS — E878 Other disorders of electrolyte and fluid balance, not elsewhere classified: Secondary | ICD-10-CM | POA: Diagnosis not present

## 2023-01-29 DIAGNOSIS — E876 Hypokalemia: Secondary | ICD-10-CM | POA: Diagnosis not present

## 2023-01-29 DIAGNOSIS — E8770 Fluid overload, unspecified: Secondary | ICD-10-CM | POA: Diagnosis not present

## 2023-01-29 DIAGNOSIS — G9340 Encephalopathy, unspecified: Secondary | ICD-10-CM | POA: Diagnosis not present

## 2023-01-29 DIAGNOSIS — E872 Acidosis, unspecified: Secondary | ICD-10-CM | POA: Diagnosis not present

## 2023-01-29 DIAGNOSIS — F1021 Alcohol dependence, in remission: Secondary | ICD-10-CM | POA: Diagnosis not present

## 2023-01-29 DIAGNOSIS — E878 Other disorders of electrolyte and fluid balance, not elsewhere classified: Secondary | ICD-10-CM | POA: Diagnosis not present

## 2023-02-02 DIAGNOSIS — Z79899 Other long term (current) drug therapy: Secondary | ICD-10-CM | POA: Diagnosis not present

## 2023-02-02 DIAGNOSIS — E878 Other disorders of electrolyte and fluid balance, not elsewhere classified: Secondary | ICD-10-CM | POA: Diagnosis not present

## 2023-02-02 DIAGNOSIS — F1111 Opioid abuse, in remission: Secondary | ICD-10-CM | POA: Diagnosis not present

## 2023-02-02 DIAGNOSIS — I82511 Chronic embolism and thrombosis of right femoral vein: Secondary | ICD-10-CM | POA: Diagnosis not present

## 2023-02-02 DIAGNOSIS — E876 Hypokalemia: Secondary | ICD-10-CM | POA: Diagnosis not present

## 2023-02-02 DIAGNOSIS — K912 Postsurgical malabsorption, not elsewhere classified: Secondary | ICD-10-CM | POA: Diagnosis not present

## 2023-02-02 DIAGNOSIS — F1021 Alcohol dependence, in remission: Secondary | ICD-10-CM | POA: Diagnosis not present

## 2023-02-02 DIAGNOSIS — E8809 Other disorders of plasma-protein metabolism, not elsewhere classified: Secondary | ICD-10-CM | POA: Diagnosis not present

## 2023-02-02 DIAGNOSIS — D689 Coagulation defect, unspecified: Secondary | ICD-10-CM | POA: Diagnosis not present

## 2023-02-02 DIAGNOSIS — R790 Abnormal level of blood mineral: Secondary | ICD-10-CM | POA: Diagnosis not present

## 2023-02-02 DIAGNOSIS — D75839 Thrombocytosis, unspecified: Secondary | ICD-10-CM | POA: Diagnosis not present

## 2023-02-05 DIAGNOSIS — Z86718 Personal history of other venous thrombosis and embolism: Secondary | ICD-10-CM | POA: Diagnosis not present

## 2023-02-05 DIAGNOSIS — K912 Postsurgical malabsorption, not elsewhere classified: Secondary | ICD-10-CM | POA: Diagnosis not present

## 2023-02-05 DIAGNOSIS — R569 Unspecified convulsions: Secondary | ICD-10-CM | POA: Diagnosis not present

## 2023-02-05 DIAGNOSIS — D693 Immune thrombocytopenic purpura: Secondary | ICD-10-CM | POA: Diagnosis not present

## 2023-02-05 DIAGNOSIS — D72829 Elevated white blood cell count, unspecified: Secondary | ICD-10-CM | POA: Diagnosis not present

## 2023-02-05 DIAGNOSIS — F4310 Post-traumatic stress disorder, unspecified: Secondary | ICD-10-CM | POA: Diagnosis not present

## 2023-02-05 DIAGNOSIS — G8929 Other chronic pain: Secondary | ICD-10-CM | POA: Diagnosis not present

## 2023-02-05 DIAGNOSIS — E878 Other disorders of electrolyte and fluid balance, not elsewhere classified: Secondary | ICD-10-CM | POA: Diagnosis not present

## 2023-02-05 DIAGNOSIS — Z888 Allergy status to other drugs, medicaments and biological substances status: Secondary | ICD-10-CM | POA: Diagnosis not present

## 2023-02-05 DIAGNOSIS — I10 Essential (primary) hypertension: Secondary | ICD-10-CM | POA: Diagnosis not present

## 2023-02-05 DIAGNOSIS — F411 Generalized anxiety disorder: Secondary | ICD-10-CM | POA: Diagnosis not present

## 2023-02-05 DIAGNOSIS — E8809 Other disorders of plasma-protein metabolism, not elsewhere classified: Secondary | ICD-10-CM | POA: Diagnosis not present

## 2023-02-05 DIAGNOSIS — G40409 Other generalized epilepsy and epileptic syndromes, not intractable, without status epilepticus: Secondary | ICD-10-CM | POA: Diagnosis not present

## 2023-02-08 DIAGNOSIS — R569 Unspecified convulsions: Secondary | ICD-10-CM | POA: Diagnosis not present

## 2023-02-09 DIAGNOSIS — R569 Unspecified convulsions: Secondary | ICD-10-CM | POA: Diagnosis not present

## 2023-02-10 DIAGNOSIS — R569 Unspecified convulsions: Secondary | ICD-10-CM | POA: Diagnosis not present

## 2023-02-11 DIAGNOSIS — R569 Unspecified convulsions: Secondary | ICD-10-CM | POA: Diagnosis not present

## 2023-02-13 DIAGNOSIS — D693 Immune thrombocytopenic purpura: Secondary | ICD-10-CM | POA: Diagnosis not present

## 2023-02-13 DIAGNOSIS — G44311 Acute post-traumatic headache, intractable: Secondary | ICD-10-CM | POA: Diagnosis not present

## 2023-02-13 DIAGNOSIS — G40909 Epilepsy, unspecified, not intractable, without status epilepticus: Secondary | ICD-10-CM | POA: Diagnosis not present

## 2023-02-13 DIAGNOSIS — S0990XA Unspecified injury of head, initial encounter: Secondary | ICD-10-CM | POA: Diagnosis not present

## 2023-02-13 DIAGNOSIS — R519 Headache, unspecified: Secondary | ICD-10-CM | POA: Diagnosis not present

## 2023-02-13 DIAGNOSIS — Z7901 Long term (current) use of anticoagulants: Secondary | ICD-10-CM | POA: Diagnosis not present

## 2023-02-13 DIAGNOSIS — I10 Essential (primary) hypertension: Secondary | ICD-10-CM | POA: Diagnosis not present

## 2023-02-13 DIAGNOSIS — Z86718 Personal history of other venous thrombosis and embolism: Secondary | ICD-10-CM | POA: Diagnosis not present

## 2023-02-15 DIAGNOSIS — Z9884 Bariatric surgery status: Secondary | ICD-10-CM | POA: Diagnosis not present

## 2023-02-15 DIAGNOSIS — D693 Immune thrombocytopenic purpura: Secondary | ICD-10-CM | POA: Diagnosis not present

## 2023-02-16 DIAGNOSIS — R770 Abnormality of albumin: Secondary | ICD-10-CM | POA: Diagnosis not present

## 2023-02-16 DIAGNOSIS — R000 Tachycardia, unspecified: Secondary | ICD-10-CM | POA: Diagnosis not present

## 2023-02-16 DIAGNOSIS — G40909 Epilepsy, unspecified, not intractable, without status epilepticus: Secondary | ICD-10-CM | POA: Diagnosis not present

## 2023-02-16 DIAGNOSIS — Z9884 Bariatric surgery status: Secondary | ICD-10-CM | POA: Diagnosis not present

## 2023-02-16 DIAGNOSIS — R0609 Other forms of dyspnea: Secondary | ICD-10-CM | POA: Diagnosis not present

## 2023-02-16 DIAGNOSIS — D649 Anemia, unspecified: Secondary | ICD-10-CM | POA: Diagnosis not present

## 2023-02-16 DIAGNOSIS — I82511 Chronic embolism and thrombosis of right femoral vein: Secondary | ICD-10-CM | POA: Diagnosis not present

## 2023-02-16 DIAGNOSIS — I82433 Acute embolism and thrombosis of popliteal vein, bilateral: Secondary | ICD-10-CM | POA: Diagnosis not present

## 2023-02-16 DIAGNOSIS — E8809 Other disorders of plasma-protein metabolism, not elsewhere classified: Secondary | ICD-10-CM | POA: Diagnosis not present

## 2023-02-16 DIAGNOSIS — D72829 Elevated white blood cell count, unspecified: Secondary | ICD-10-CM | POA: Diagnosis not present

## 2023-02-16 DIAGNOSIS — E786 Lipoprotein deficiency: Secondary | ICD-10-CM | POA: Diagnosis not present

## 2023-02-16 DIAGNOSIS — F39 Unspecified mood [affective] disorder: Secondary | ICD-10-CM | POA: Diagnosis not present

## 2023-02-16 DIAGNOSIS — K912 Postsurgical malabsorption, not elsewhere classified: Secondary | ICD-10-CM | POA: Diagnosis not present

## 2023-02-16 DIAGNOSIS — I82413 Acute embolism and thrombosis of femoral vein, bilateral: Secondary | ICD-10-CM | POA: Diagnosis not present

## 2023-02-16 DIAGNOSIS — R600 Localized edema: Secondary | ICD-10-CM | POA: Diagnosis not present

## 2023-02-16 DIAGNOSIS — E8770 Fluid overload, unspecified: Secondary | ICD-10-CM | POA: Diagnosis not present

## 2023-02-16 DIAGNOSIS — Z862 Personal history of diseases of the blood and blood-forming organs and certain disorders involving the immune mechanism: Secondary | ICD-10-CM | POA: Diagnosis not present

## 2023-02-16 DIAGNOSIS — E878 Other disorders of electrolyte and fluid balance, not elsewhere classified: Secondary | ICD-10-CM | POA: Diagnosis not present

## 2023-02-16 DIAGNOSIS — R609 Edema, unspecified: Secondary | ICD-10-CM | POA: Diagnosis not present

## 2023-02-16 DIAGNOSIS — K909 Intestinal malabsorption, unspecified: Secondary | ICD-10-CM | POA: Diagnosis not present

## 2023-02-16 DIAGNOSIS — K760 Fatty (change of) liver, not elsewhere classified: Secondary | ICD-10-CM | POA: Diagnosis not present

## 2023-02-16 DIAGNOSIS — K838 Other specified diseases of biliary tract: Secondary | ICD-10-CM | POA: Diagnosis not present

## 2023-02-16 DIAGNOSIS — R569 Unspecified convulsions: Secondary | ICD-10-CM | POA: Diagnosis not present

## 2023-02-24 DIAGNOSIS — Z79891 Long term (current) use of opiate analgesic: Secondary | ICD-10-CM | POA: Diagnosis not present

## 2023-02-24 DIAGNOSIS — M25571 Pain in right ankle and joints of right foot: Secondary | ICD-10-CM | POA: Diagnosis not present

## 2023-02-24 DIAGNOSIS — G894 Chronic pain syndrome: Secondary | ICD-10-CM | POA: Diagnosis not present

## 2023-02-24 DIAGNOSIS — D693 Immune thrombocytopenic purpura: Secondary | ICD-10-CM | POA: Diagnosis not present

## 2023-02-24 DIAGNOSIS — F1111 Opioid abuse, in remission: Secondary | ICD-10-CM | POA: Diagnosis not present

## 2023-02-24 DIAGNOSIS — E8809 Other disorders of plasma-protein metabolism, not elsewhere classified: Secondary | ICD-10-CM | POA: Diagnosis not present

## 2023-02-24 DIAGNOSIS — G8929 Other chronic pain: Secondary | ICD-10-CM | POA: Diagnosis not present

## 2023-02-24 DIAGNOSIS — I82511 Chronic embolism and thrombosis of right femoral vein: Secondary | ICD-10-CM | POA: Diagnosis not present

## 2023-02-24 DIAGNOSIS — Z9884 Bariatric surgery status: Secondary | ICD-10-CM | POA: Diagnosis not present

## 2023-02-24 DIAGNOSIS — R600 Localized edema: Secondary | ICD-10-CM | POA: Diagnosis not present

## 2023-02-24 DIAGNOSIS — Z5181 Encounter for therapeutic drug level monitoring: Secondary | ICD-10-CM | POA: Diagnosis not present

## 2023-02-24 DIAGNOSIS — R110 Nausea: Secondary | ICD-10-CM | POA: Diagnosis not present

## 2023-02-24 DIAGNOSIS — K912 Postsurgical malabsorption, not elsewhere classified: Secondary | ICD-10-CM | POA: Diagnosis not present

## 2023-02-24 DIAGNOSIS — D72829 Elevated white blood cell count, unspecified: Secondary | ICD-10-CM | POA: Diagnosis not present

## 2023-02-28 DIAGNOSIS — I10 Essential (primary) hypertension: Secondary | ICD-10-CM | POA: Diagnosis not present

## 2023-02-28 DIAGNOSIS — D72829 Elevated white blood cell count, unspecified: Secondary | ICD-10-CM | POA: Diagnosis not present

## 2023-02-28 DIAGNOSIS — J90 Pleural effusion, not elsewhere classified: Secondary | ICD-10-CM | POA: Diagnosis not present

## 2023-02-28 DIAGNOSIS — K6389 Other specified diseases of intestine: Secondary | ICD-10-CM | POA: Diagnosis not present

## 2023-02-28 DIAGNOSIS — G40909 Epilepsy, unspecified, not intractable, without status epilepticus: Secondary | ICD-10-CM | POA: Diagnosis not present

## 2023-02-28 DIAGNOSIS — K912 Postsurgical malabsorption, not elsewhere classified: Secondary | ICD-10-CM | POA: Diagnosis not present

## 2023-02-28 DIAGNOSIS — E8809 Other disorders of plasma-protein metabolism, not elsewhere classified: Secondary | ICD-10-CM | POA: Diagnosis not present

## 2023-02-28 DIAGNOSIS — K760 Fatty (change of) liver, not elsewhere classified: Secondary | ICD-10-CM | POA: Diagnosis not present

## 2023-02-28 DIAGNOSIS — Z87891 Personal history of nicotine dependence: Secondary | ICD-10-CM | POA: Diagnosis not present

## 2023-02-28 DIAGNOSIS — R0789 Other chest pain: Secondary | ICD-10-CM | POA: Diagnosis not present

## 2023-02-28 DIAGNOSIS — I82411 Acute embolism and thrombosis of right femoral vein: Secondary | ICD-10-CM | POA: Diagnosis not present

## 2023-02-28 DIAGNOSIS — K759 Inflammatory liver disease, unspecified: Secondary | ICD-10-CM | POA: Diagnosis not present

## 2023-02-28 DIAGNOSIS — R5383 Other fatigue: Secondary | ICD-10-CM | POA: Diagnosis not present

## 2023-02-28 DIAGNOSIS — R1032 Left lower quadrant pain: Secondary | ICD-10-CM | POA: Diagnosis not present

## 2023-02-28 DIAGNOSIS — Z7901 Long term (current) use of anticoagulants: Secondary | ICD-10-CM | POA: Diagnosis not present

## 2023-02-28 DIAGNOSIS — R4182 Altered mental status, unspecified: Secondary | ICD-10-CM | POA: Diagnosis not present

## 2023-02-28 DIAGNOSIS — D693 Immune thrombocytopenic purpura: Secondary | ICD-10-CM | POA: Diagnosis not present

## 2023-02-28 DIAGNOSIS — R6510 Systemic inflammatory response syndrome (SIRS) of non-infectious origin without acute organ dysfunction: Secondary | ICD-10-CM | POA: Diagnosis not present

## 2023-02-28 DIAGNOSIS — Z9884 Bariatric surgery status: Secondary | ICD-10-CM | POA: Diagnosis not present

## 2023-02-28 DIAGNOSIS — R079 Chest pain, unspecified: Secondary | ICD-10-CM | POA: Diagnosis not present

## 2023-02-28 DIAGNOSIS — B372 Candidiasis of skin and nail: Secondary | ICD-10-CM | POA: Diagnosis not present

## 2023-02-28 DIAGNOSIS — Z8669 Personal history of other diseases of the nervous system and sense organs: Secondary | ICD-10-CM | POA: Diagnosis not present

## 2023-02-28 DIAGNOSIS — F39 Unspecified mood [affective] disorder: Secondary | ICD-10-CM | POA: Diagnosis not present

## 2023-02-28 DIAGNOSIS — R5381 Other malaise: Secondary | ICD-10-CM | POA: Diagnosis not present

## 2023-02-28 DIAGNOSIS — K529 Noninfective gastroenteritis and colitis, unspecified: Secondary | ICD-10-CM | POA: Diagnosis not present

## 2023-02-28 DIAGNOSIS — I890 Lymphedema, not elsewhere classified: Secondary | ICD-10-CM | POA: Diagnosis not present

## 2023-02-28 DIAGNOSIS — E559 Vitamin D deficiency, unspecified: Secondary | ICD-10-CM | POA: Diagnosis not present

## 2023-02-28 DIAGNOSIS — F32A Depression, unspecified: Secondary | ICD-10-CM | POA: Diagnosis not present

## 2023-02-28 DIAGNOSIS — K567 Ileus, unspecified: Secondary | ICD-10-CM | POA: Diagnosis not present

## 2023-03-01 DIAGNOSIS — Z9884 Bariatric surgery status: Secondary | ICD-10-CM | POA: Diagnosis not present

## 2023-03-01 DIAGNOSIS — R4182 Altered mental status, unspecified: Secondary | ICD-10-CM | POA: Diagnosis not present

## 2023-03-01 DIAGNOSIS — K760 Fatty (change of) liver, not elsewhere classified: Secondary | ICD-10-CM | POA: Diagnosis not present

## 2023-03-01 DIAGNOSIS — K567 Ileus, unspecified: Secondary | ICD-10-CM | POA: Diagnosis not present

## 2023-03-01 DIAGNOSIS — K6389 Other specified diseases of intestine: Secondary | ICD-10-CM | POA: Diagnosis not present

## 2023-03-01 DIAGNOSIS — D72829 Elevated white blood cell count, unspecified: Secondary | ICD-10-CM | POA: Diagnosis not present

## 2023-03-01 DIAGNOSIS — J90 Pleural effusion, not elsewhere classified: Secondary | ICD-10-CM | POA: Diagnosis not present

## 2023-03-02 DIAGNOSIS — R079 Chest pain, unspecified: Secondary | ICD-10-CM | POA: Diagnosis not present

## 2023-03-03 DIAGNOSIS — F32A Depression, unspecified: Secondary | ICD-10-CM | POA: Diagnosis not present

## 2023-03-07 DIAGNOSIS — F331 Major depressive disorder, recurrent, moderate: Secondary | ICD-10-CM | POA: Diagnosis not present

## 2023-03-07 DIAGNOSIS — F419 Anxiety disorder, unspecified: Secondary | ICD-10-CM | POA: Diagnosis not present

## 2023-03-07 DIAGNOSIS — G40909 Epilepsy, unspecified, not intractable, without status epilepticus: Secondary | ICD-10-CM | POA: Diagnosis not present

## 2023-03-07 DIAGNOSIS — I82431 Acute embolism and thrombosis of right popliteal vein: Secondary | ICD-10-CM | POA: Diagnosis not present

## 2023-03-07 DIAGNOSIS — I82411 Acute embolism and thrombosis of right femoral vein: Secondary | ICD-10-CM | POA: Diagnosis not present

## 2023-03-07 DIAGNOSIS — I10 Essential (primary) hypertension: Secondary | ICD-10-CM | POA: Diagnosis not present

## 2023-03-07 DIAGNOSIS — G8929 Other chronic pain: Secondary | ICD-10-CM | POA: Diagnosis not present

## 2023-03-07 DIAGNOSIS — D693 Immune thrombocytopenic purpura: Secondary | ICD-10-CM | POA: Diagnosis not present

## 2023-03-07 DIAGNOSIS — K760 Fatty (change of) liver, not elsewhere classified: Secondary | ICD-10-CM | POA: Diagnosis not present

## 2023-03-08 DIAGNOSIS — I82511 Chronic embolism and thrombosis of right femoral vein: Secondary | ICD-10-CM | POA: Diagnosis not present

## 2023-03-08 DIAGNOSIS — I890 Lymphedema, not elsewhere classified: Secondary | ICD-10-CM | POA: Diagnosis not present

## 2023-03-08 DIAGNOSIS — E8809 Other disorders of plasma-protein metabolism, not elsewhere classified: Secondary | ICD-10-CM | POA: Diagnosis not present

## 2023-03-08 DIAGNOSIS — K912 Postsurgical malabsorption, not elsewhere classified: Secondary | ICD-10-CM | POA: Diagnosis not present

## 2023-03-08 DIAGNOSIS — D693 Immune thrombocytopenic purpura: Secondary | ICD-10-CM | POA: Diagnosis not present

## 2023-03-08 DIAGNOSIS — E878 Other disorders of electrolyte and fluid balance, not elsewhere classified: Secondary | ICD-10-CM | POA: Diagnosis not present

## 2023-03-08 DIAGNOSIS — M79605 Pain in left leg: Secondary | ICD-10-CM | POA: Diagnosis not present

## 2023-03-08 DIAGNOSIS — M79604 Pain in right leg: Secondary | ICD-10-CM | POA: Diagnosis not present

## 2023-03-08 DIAGNOSIS — F411 Generalized anxiety disorder: Secondary | ICD-10-CM | POA: Diagnosis not present

## 2023-03-08 DIAGNOSIS — K529 Noninfective gastroenteritis and colitis, unspecified: Secondary | ICD-10-CM | POA: Diagnosis not present

## 2023-03-08 DIAGNOSIS — F32A Depression, unspecified: Secondary | ICD-10-CM | POA: Diagnosis not present

## 2023-03-09 DIAGNOSIS — Z7189 Other specified counseling: Secondary | ICD-10-CM | POA: Diagnosis not present

## 2023-03-09 DIAGNOSIS — F1111 Opioid abuse, in remission: Secondary | ICD-10-CM | POA: Diagnosis not present

## 2023-03-09 DIAGNOSIS — Z9884 Bariatric surgery status: Secondary | ICD-10-CM | POA: Diagnosis not present

## 2023-03-09 DIAGNOSIS — K9089 Other intestinal malabsorption: Secondary | ICD-10-CM | POA: Diagnosis not present

## 2023-03-09 DIAGNOSIS — R4589 Other symptoms and signs involving emotional state: Secondary | ICD-10-CM | POA: Diagnosis not present

## 2023-03-09 DIAGNOSIS — D693 Immune thrombocytopenic purpura: Secondary | ICD-10-CM | POA: Diagnosis not present

## 2023-03-09 DIAGNOSIS — I890 Lymphedema, not elsewhere classified: Secondary | ICD-10-CM | POA: Diagnosis not present

## 2023-03-09 DIAGNOSIS — Z515 Encounter for palliative care: Secondary | ICD-10-CM | POA: Diagnosis not present

## 2023-03-09 DIAGNOSIS — Z789 Other specified health status: Secondary | ICD-10-CM | POA: Diagnosis not present

## 2023-03-10 DIAGNOSIS — Z713 Dietary counseling and surveillance: Secondary | ICD-10-CM | POA: Diagnosis not present

## 2023-03-10 DIAGNOSIS — F54 Psychological and behavioral factors associated with disorders or diseases classified elsewhere: Secondary | ICD-10-CM | POA: Diagnosis not present

## 2023-03-10 DIAGNOSIS — I890 Lymphedema, not elsewhere classified: Secondary | ICD-10-CM | POA: Diagnosis not present

## 2023-03-10 DIAGNOSIS — F1111 Opioid abuse, in remission: Secondary | ICD-10-CM | POA: Diagnosis not present

## 2023-03-10 DIAGNOSIS — G894 Chronic pain syndrome: Secondary | ICD-10-CM | POA: Diagnosis not present

## 2023-03-10 DIAGNOSIS — R1319 Other dysphagia: Secondary | ICD-10-CM | POA: Diagnosis not present

## 2023-03-10 DIAGNOSIS — F1021 Alcohol dependence, in remission: Secondary | ICD-10-CM | POA: Diagnosis not present

## 2023-03-10 DIAGNOSIS — Z9884 Bariatric surgery status: Secondary | ICD-10-CM | POA: Diagnosis not present

## 2023-03-10 DIAGNOSIS — Z862 Personal history of diseases of the blood and blood-forming organs and certain disorders involving the immune mechanism: Secondary | ICD-10-CM | POA: Diagnosis not present

## 2023-03-11 DIAGNOSIS — Z48815 Encounter for surgical aftercare following surgery on the digestive system: Secondary | ICD-10-CM | POA: Diagnosis not present

## 2023-03-11 DIAGNOSIS — Z713 Dietary counseling and surveillance: Secondary | ICD-10-CM | POA: Diagnosis not present

## 2023-03-11 DIAGNOSIS — I890 Lymphedema, not elsewhere classified: Secondary | ICD-10-CM | POA: Diagnosis not present

## 2023-03-11 DIAGNOSIS — Z9884 Bariatric surgery status: Secondary | ICD-10-CM | POA: Diagnosis not present

## 2023-03-11 DIAGNOSIS — Z6839 Body mass index (BMI) 39.0-39.9, adult: Secondary | ICD-10-CM | POA: Diagnosis not present

## 2023-03-14 DIAGNOSIS — Z7901 Long term (current) use of anticoagulants: Secondary | ICD-10-CM | POA: Diagnosis not present

## 2023-03-14 DIAGNOSIS — L03116 Cellulitis of left lower limb: Secondary | ICD-10-CM | POA: Diagnosis not present

## 2023-03-14 DIAGNOSIS — Z5181 Encounter for therapeutic drug level monitoring: Secondary | ICD-10-CM | POA: Diagnosis not present

## 2023-03-14 DIAGNOSIS — L03115 Cellulitis of right lower limb: Secondary | ICD-10-CM | POA: Diagnosis not present

## 2023-03-14 DIAGNOSIS — D693 Immune thrombocytopenic purpura: Secondary | ICD-10-CM | POA: Diagnosis not present

## 2023-03-14 DIAGNOSIS — Z79899 Other long term (current) drug therapy: Secondary | ICD-10-CM | POA: Diagnosis not present

## 2023-03-14 DIAGNOSIS — D649 Anemia, unspecified: Secondary | ICD-10-CM | POA: Diagnosis not present

## 2023-03-14 DIAGNOSIS — Z87891 Personal history of nicotine dependence: Secondary | ICD-10-CM | POA: Diagnosis not present

## 2023-03-16 DIAGNOSIS — R609 Edema, unspecified: Secondary | ICD-10-CM | POA: Diagnosis not present

## 2023-03-16 DIAGNOSIS — K912 Postsurgical malabsorption, not elsewhere classified: Secondary | ICD-10-CM | POA: Diagnosis not present

## 2023-03-16 DIAGNOSIS — I890 Lymphedema, not elsewhere classified: Secondary | ICD-10-CM | POA: Diagnosis not present

## 2023-03-16 DIAGNOSIS — T7840XA Allergy, unspecified, initial encounter: Secondary | ICD-10-CM | POA: Diagnosis not present

## 2023-03-22 DIAGNOSIS — R748 Abnormal levels of other serum enzymes: Secondary | ICD-10-CM | POA: Diagnosis not present

## 2023-03-22 DIAGNOSIS — D693 Immune thrombocytopenic purpura: Secondary | ICD-10-CM | POA: Diagnosis not present

## 2023-03-24 DIAGNOSIS — G8929 Other chronic pain: Secondary | ICD-10-CM | POA: Diagnosis not present

## 2023-03-24 DIAGNOSIS — Z9884 Bariatric surgery status: Secondary | ICD-10-CM | POA: Diagnosis not present

## 2023-03-24 DIAGNOSIS — D72829 Elevated white blood cell count, unspecified: Secondary | ICD-10-CM | POA: Diagnosis not present

## 2023-03-24 DIAGNOSIS — M2550 Pain in unspecified joint: Secondary | ICD-10-CM | POA: Diagnosis not present

## 2023-03-24 DIAGNOSIS — E559 Vitamin D deficiency, unspecified: Secondary | ICD-10-CM | POA: Diagnosis not present

## 2023-04-01 DIAGNOSIS — H53023 Refractive amblyopia, bilateral: Secondary | ICD-10-CM | POA: Diagnosis not present

## 2023-04-02 DIAGNOSIS — D693 Immune thrombocytopenic purpura: Secondary | ICD-10-CM | POA: Diagnosis not present

## 2023-04-02 DIAGNOSIS — I890 Lymphedema, not elsewhere classified: Secondary | ICD-10-CM | POA: Diagnosis not present

## 2023-04-02 DIAGNOSIS — R609 Edema, unspecified: Secondary | ICD-10-CM | POA: Diagnosis not present

## 2023-04-02 DIAGNOSIS — E878 Other disorders of electrolyte and fluid balance, not elsewhere classified: Secondary | ICD-10-CM | POA: Diagnosis not present

## 2023-04-05 DIAGNOSIS — R609 Edema, unspecified: Secondary | ICD-10-CM | POA: Diagnosis not present

## 2023-04-05 DIAGNOSIS — I890 Lymphedema, not elsewhere classified: Secondary | ICD-10-CM | POA: Diagnosis not present

## 2023-04-08 DIAGNOSIS — M7989 Other specified soft tissue disorders: Secondary | ICD-10-CM | POA: Diagnosis not present

## 2023-04-08 DIAGNOSIS — R040 Epistaxis: Secondary | ICD-10-CM | POA: Diagnosis not present

## 2023-04-08 DIAGNOSIS — R000 Tachycardia, unspecified: Secondary | ICD-10-CM | POA: Diagnosis not present

## 2023-04-08 DIAGNOSIS — Z9884 Bariatric surgery status: Secondary | ICD-10-CM | POA: Diagnosis not present

## 2023-04-08 DIAGNOSIS — E876 Hypokalemia: Secondary | ICD-10-CM | POA: Diagnosis not present

## 2023-04-08 DIAGNOSIS — E8809 Other disorders of plasma-protein metabolism, not elsewhere classified: Secondary | ICD-10-CM | POA: Diagnosis not present

## 2023-04-08 DIAGNOSIS — D696 Thrombocytopenia, unspecified: Secondary | ICD-10-CM | POA: Diagnosis not present

## 2023-04-08 DIAGNOSIS — Z7901 Long term (current) use of anticoagulants: Secondary | ICD-10-CM | POA: Diagnosis not present

## 2023-04-08 DIAGNOSIS — D693 Immune thrombocytopenic purpura: Secondary | ICD-10-CM | POA: Diagnosis not present

## 2023-04-08 DIAGNOSIS — R7989 Other specified abnormal findings of blood chemistry: Secondary | ICD-10-CM | POA: Diagnosis not present

## 2023-04-08 DIAGNOSIS — R600 Localized edema: Secondary | ICD-10-CM | POA: Diagnosis not present

## 2023-04-09 DIAGNOSIS — F330 Major depressive disorder, recurrent, mild: Secondary | ICD-10-CM | POA: Diagnosis not present

## 2023-04-09 DIAGNOSIS — F411 Generalized anxiety disorder: Secondary | ICD-10-CM | POA: Diagnosis not present

## 2023-04-12 ENCOUNTER — Emergency Department: Payer: Medicare HMO

## 2023-04-12 ENCOUNTER — Other Ambulatory Visit: Payer: Self-pay

## 2023-04-12 ENCOUNTER — Inpatient Hospital Stay
Admission: EM | Admit: 2023-04-12 | Discharge: 2023-04-13 | DRG: 194 | Discharge status: Against Medical Advice | Payer: Medicare HMO | Attending: Internal Medicine | Admitting: Internal Medicine

## 2023-04-12 ENCOUNTER — Encounter: Payer: Self-pay | Admitting: Intensive Care

## 2023-04-12 DIAGNOSIS — Z813 Family history of other psychoactive substance abuse and dependence: Secondary | ICD-10-CM | POA: Diagnosis not present

## 2023-04-12 DIAGNOSIS — Z9081 Acquired absence of spleen: Secondary | ICD-10-CM | POA: Diagnosis not present

## 2023-04-12 DIAGNOSIS — J189 Pneumonia, unspecified organism: Secondary | ICD-10-CM | POA: Diagnosis present

## 2023-04-12 DIAGNOSIS — F32A Depression, unspecified: Secondary | ICD-10-CM | POA: Diagnosis present

## 2023-04-12 DIAGNOSIS — F1990 Other psychoactive substance use, unspecified, uncomplicated: Secondary | ICD-10-CM | POA: Diagnosis present

## 2023-04-12 DIAGNOSIS — Z7151 Drug abuse counseling and surveillance of drug abuser: Secondary | ICD-10-CM | POA: Diagnosis not present

## 2023-04-12 DIAGNOSIS — F10129 Alcohol abuse with intoxication, unspecified: Secondary | ICD-10-CM | POA: Diagnosis present

## 2023-04-12 DIAGNOSIS — Z881 Allergy status to other antibiotic agents status: Secondary | ICD-10-CM | POA: Diagnosis not present

## 2023-04-12 DIAGNOSIS — G9340 Encephalopathy, unspecified: Principal | ICD-10-CM

## 2023-04-12 DIAGNOSIS — Z7141 Alcohol abuse counseling and surveillance of alcoholic: Secondary | ICD-10-CM

## 2023-04-12 DIAGNOSIS — Z793 Long term (current) use of hormonal contraceptives: Secondary | ICD-10-CM

## 2023-04-12 DIAGNOSIS — G40909 Epilepsy, unspecified, not intractable, without status epilepticus: Secondary | ICD-10-CM | POA: Diagnosis present

## 2023-04-12 DIAGNOSIS — E876 Hypokalemia: Secondary | ICD-10-CM

## 2023-04-12 DIAGNOSIS — F419 Anxiety disorder, unspecified: Secondary | ICD-10-CM | POA: Diagnosis present

## 2023-04-12 DIAGNOSIS — Z79899 Other long term (current) drug therapy: Secondary | ICD-10-CM

## 2023-04-12 DIAGNOSIS — G928 Other toxic encephalopathy: Secondary | ICD-10-CM | POA: Diagnosis present

## 2023-04-12 DIAGNOSIS — R197 Diarrhea, unspecified: Secondary | ICD-10-CM

## 2023-04-12 DIAGNOSIS — A419 Sepsis, unspecified organism: Secondary | ICD-10-CM | POA: Diagnosis not present

## 2023-04-12 DIAGNOSIS — K760 Fatty (change of) liver, not elsewhere classified: Secondary | ICD-10-CM | POA: Diagnosis not present

## 2023-04-12 DIAGNOSIS — Z87891 Personal history of nicotine dependence: Secondary | ICD-10-CM

## 2023-04-12 DIAGNOSIS — Z888 Allergy status to other drugs, medicaments and biological substances status: Secondary | ICD-10-CM | POA: Diagnosis not present

## 2023-04-12 DIAGNOSIS — Z1152 Encounter for screening for COVID-19: Secondary | ICD-10-CM | POA: Diagnosis not present

## 2023-04-12 DIAGNOSIS — D649 Anemia, unspecified: Secondary | ICD-10-CM | POA: Diagnosis present

## 2023-04-12 DIAGNOSIS — R4182 Altered mental status, unspecified: Secondary | ICD-10-CM | POA: Diagnosis not present

## 2023-04-12 DIAGNOSIS — Z9049 Acquired absence of other specified parts of digestive tract: Secondary | ICD-10-CM

## 2023-04-12 DIAGNOSIS — Z66 Do not resuscitate: Secondary | ICD-10-CM | POA: Diagnosis present

## 2023-04-12 DIAGNOSIS — R918 Other nonspecific abnormal finding of lung field: Secondary | ICD-10-CM | POA: Diagnosis not present

## 2023-04-12 DIAGNOSIS — D7589 Other specified diseases of blood and blood-forming organs: Secondary | ICD-10-CM | POA: Diagnosis present

## 2023-04-12 DIAGNOSIS — F10921 Alcohol use, unspecified with intoxication delirium: Secondary | ICD-10-CM | POA: Diagnosis not present

## 2023-04-12 DIAGNOSIS — Z86718 Personal history of other venous thrombosis and embolism: Secondary | ICD-10-CM

## 2023-04-12 DIAGNOSIS — Y907 Blood alcohol level of 200-239 mg/100 ml: Secondary | ICD-10-CM | POA: Diagnosis present

## 2023-04-12 DIAGNOSIS — R609 Edema, unspecified: Secondary | ICD-10-CM | POA: Diagnosis not present

## 2023-04-12 DIAGNOSIS — R531 Weakness: Secondary | ICD-10-CM | POA: Diagnosis not present

## 2023-04-12 DIAGNOSIS — N390 Urinary tract infection, site not specified: Secondary | ICD-10-CM | POA: Diagnosis not present

## 2023-04-12 DIAGNOSIS — Z9884 Bariatric surgery status: Secondary | ICD-10-CM | POA: Diagnosis not present

## 2023-04-12 DIAGNOSIS — G9349 Other encephalopathy: Secondary | ICD-10-CM | POA: Diagnosis present

## 2023-04-12 DIAGNOSIS — D693 Immune thrombocytopenic purpura: Secondary | ICD-10-CM | POA: Diagnosis present

## 2023-04-12 DIAGNOSIS — F1910 Other psychoactive substance abuse, uncomplicated: Secondary | ICD-10-CM | POA: Insufficient documentation

## 2023-04-12 DIAGNOSIS — I959 Hypotension, unspecified: Secondary | ICD-10-CM | POA: Diagnosis not present

## 2023-04-12 DIAGNOSIS — Z8249 Family history of ischemic heart disease and other diseases of the circulatory system: Secondary | ICD-10-CM

## 2023-04-12 DIAGNOSIS — Z79891 Long term (current) use of opiate analgesic: Secondary | ICD-10-CM

## 2023-04-12 LAB — RESP PANEL BY RT-PCR (RSV, FLU A&B, COVID)  RVPGX2
Influenza A by PCR: NEGATIVE
Influenza B by PCR: NEGATIVE
Resp Syncytial Virus by PCR: NEGATIVE
SARS Coronavirus 2 by RT PCR: NEGATIVE

## 2023-04-12 LAB — CBC WITH DIFFERENTIAL/PLATELET
Abs Immature Granulocytes: 0.01 10*3/uL (ref 0.00–0.07)
Basophils Absolute: 0.1 10*3/uL (ref 0.0–0.1)
Basophils Relative: 1 %
Eosinophils Absolute: 0 10*3/uL (ref 0.0–0.5)
Eosinophils Relative: 0 %
HCT: 34.6 % — ABNORMAL LOW (ref 36.0–46.0)
Hemoglobin: 11.9 g/dL — ABNORMAL LOW (ref 12.0–15.0)
Immature Granulocytes: 0 %
Lymphocytes Relative: 45 %
Lymphs Abs: 2.8 10*3/uL (ref 0.7–4.0)
MCH: 35.2 pg — ABNORMAL HIGH (ref 26.0–34.0)
MCHC: 34.4 g/dL (ref 30.0–36.0)
MCV: 102.4 fL — ABNORMAL HIGH (ref 80.0–100.0)
Monocytes Absolute: 1 10*3/uL (ref 0.1–1.0)
Monocytes Relative: 16 %
Neutro Abs: 2.4 10*3/uL (ref 1.7–7.7)
Neutrophils Relative %: 38 %
Platelets: 165 10*3/uL (ref 150–400)
RBC: 3.38 MIL/uL — ABNORMAL LOW (ref 3.87–5.11)
RDW: 14.1 % (ref 11.5–15.5)
WBC: 6.2 10*3/uL (ref 4.0–10.5)
nRBC: 0 % (ref 0.0–0.2)

## 2023-04-12 LAB — URINALYSIS, W/ REFLEX TO CULTURE (INFECTION SUSPECTED)
Bacteria, UA: NONE SEEN
Bilirubin Urine: NEGATIVE
Glucose, UA: NEGATIVE mg/dL
Hgb urine dipstick: NEGATIVE
Ketones, ur: 20 mg/dL — AB
Leukocytes,Ua: NEGATIVE
Nitrite: NEGATIVE
Protein, ur: NEGATIVE mg/dL
Specific Gravity, Urine: 1.019 (ref 1.005–1.030)
pH: 6 (ref 5.0–8.0)

## 2023-04-12 LAB — COMPREHENSIVE METABOLIC PANEL
ALT: 26 U/L (ref 0–44)
AST: 52 U/L — ABNORMAL HIGH (ref 15–41)
Albumin: 2.5 g/dL — ABNORMAL LOW (ref 3.5–5.0)
Alkaline Phosphatase: 130 U/L — ABNORMAL HIGH (ref 38–126)
Anion gap: 13 (ref 5–15)
BUN: 10 mg/dL (ref 6–20)
CO2: 21 mmol/L — ABNORMAL LOW (ref 22–32)
Calcium: 7.2 mg/dL — ABNORMAL LOW (ref 8.9–10.3)
Chloride: 106 mmol/L (ref 98–111)
Creatinine, Ser: 0.49 mg/dL (ref 0.44–1.00)
GFR, Estimated: >60 mL/min (ref >60)
Glucose, Bld: 96 mg/dL (ref 70–99)
Potassium: <2 mmol/L — CL (ref 3.5–5.1)
Sodium: 140 mmol/L (ref 135–145)
Total Bilirubin: 0.8 mg/dL (ref 0.3–1.2)
Total Protein: 5.7 g/dL — ABNORMAL LOW (ref 6.5–8.1)

## 2023-04-12 LAB — BLOOD GAS, VENOUS
Acid-base deficit: 1.1 mmol/L (ref 0.0–2.0)
Bicarbonate: 22.4 mmol/L (ref 20.0–28.0)
O2 Saturation: 63.9 %
Patient temperature: 37
pCO2, Ven: 33 mm[Hg] — ABNORMAL LOW (ref 44–60)
pH, Ven: 7.44 — ABNORMAL HIGH (ref 7.25–7.43)
pO2, Ven: 42 mm[Hg] (ref 32–45)

## 2023-04-12 LAB — URINE DRUG SCREEN, QUALITATIVE (ARMC ONLY)
Amphetamines, Ur Screen: NOT DETECTED
Barbiturates, Ur Screen: NOT DETECTED
Benzodiazepine, Ur Scrn: POSITIVE — AB
Cannabinoid 50 Ng, Ur ~~LOC~~: POSITIVE — AB
Cocaine Metabolite,Ur ~~LOC~~: NOT DETECTED
MDMA (Ecstasy)Ur Screen: NOT DETECTED
Methadone Scn, Ur: NOT DETECTED
Opiate, Ur Screen: NOT DETECTED
Phencyclidine (PCP) Ur S: NOT DETECTED
Tricyclic, Ur Screen: POSITIVE — AB

## 2023-04-12 LAB — PREGNANCY, URINE: Preg Test, Ur: NEGATIVE

## 2023-04-12 LAB — AMMONIA: Ammonia: 20 umol/L (ref 9–35)

## 2023-04-12 LAB — MAGNESIUM: Magnesium: 2 mg/dL (ref 1.7–2.4)

## 2023-04-12 LAB — LACTIC ACID, PLASMA: Lactic Acid, Venous: 3.6 mmol/L (ref 0.5–1.9)

## 2023-04-12 LAB — SALICYLATE LEVEL: Salicylate Lvl: <7 mg/dL — ABNORMAL LOW (ref 7.0–30.0)

## 2023-04-12 LAB — ACETAMINOPHEN LEVEL: Acetaminophen (Tylenol), Serum: <10 ug/mL — ABNORMAL LOW (ref 10–30)

## 2023-04-12 LAB — ETHANOL: Alcohol, Ethyl (B): 235 mg/dL — ABNORMAL HIGH (ref <10)

## 2023-04-12 MED ORDER — ENOXAPARIN SODIUM 40 MG/0.4ML IJ SOSY: 40.0000 mg | PREFILLED_SYRINGE | INTRAMUSCULAR | Status: DC

## 2023-04-12 MED ORDER — LORAZEPAM 2 MG/ML IJ SOLN: 1.0000 mg | INTRAMUSCULAR | Status: DC | PRN

## 2023-04-12 MED ORDER — PERAMPANEL 2 MG PO TABS: 2.0000 mg | ORAL_TABLET | Freq: Every day | ORAL | Status: DC

## 2023-04-12 MED ORDER — SODIUM CHLORIDE 0.9 % IV SOLN: 500.0000 mg | INTRAVENOUS | Status: DC

## 2023-04-12 MED ORDER — MAGNESIUM HYDROXIDE 400 MG/5ML PO SUSP: 30.0000 mL | Freq: Every day | ORAL | Status: DC | PRN

## 2023-04-12 MED ORDER — BUPRENORPHINE HCL 2 MG SL SUBL: 2.0000 mg | SUBLINGUAL_TABLET | Freq: Every day | SUBLINGUAL | Status: DC

## 2023-04-12 MED ORDER — EPINEPHRINE 0.3 MG/0.3ML IJ SOAJ: 0.3000 mg | Freq: Once | INTRAMUSCULAR | Status: DC | PRN

## 2023-04-12 MED ORDER — CYANOCOBALAMIN 500 MCG PO TABS
1000.0000 ug | ORAL_TABLET | Freq: Every day | ORAL | Status: DC
  Administered 2023-04-13: 1000 ug via ORAL
  Filled 2023-04-12: qty 2

## 2023-04-12 MED ORDER — VORTIOXETINE HBR 5 MG PO TABS
15.0000 mg | ORAL_TABLET | Freq: Every day | ORAL | Status: DC
  Administered 2023-04-13: 15 mg via ORAL
  Filled 2023-04-12: qty 3

## 2023-04-12 MED ORDER — FOSTAMATINIB DISODIUM 100 MG PO TABS: 100.0000 mg | ORAL_TABLET | Freq: Two times a day (BID) | ORAL | Status: DC

## 2023-04-12 MED ORDER — HYDROCOD POLI-CHLORPHE POLI ER 10-8 MG/5ML PO SUER: 5.0000 mL | Freq: Two times a day (BID) | ORAL | Status: DC | PRN

## 2023-04-12 MED ORDER — TRAZODONE HCL 50 MG PO TABS: 25.0000 mg | ORAL_TABLET | Freq: Every evening | ORAL | Status: DC | PRN

## 2023-04-12 MED ORDER — FOLIC ACID 1 MG PO TABS
1.0000 mg | ORAL_TABLET | Freq: Every day | ORAL | Status: DC
  Administered 2023-04-13: 1 mg via ORAL
  Filled 2023-04-12: qty 1

## 2023-04-12 MED ORDER — NORELGESTROMIN-ETH ESTRADIOL 150-35 MCG/24HR TD PTWK: 1.0000 | MEDICATED_PATCH | TRANSDERMAL | Status: DC

## 2023-04-12 MED ORDER — IPRATROPIUM BROMIDE 0.03 % NA SOLN
2.0000 | Freq: Two times a day (BID) | NASAL | Status: DC
  Filled 2023-04-12: qty 30

## 2023-04-12 MED ORDER — POTASSIUM CHLORIDE IN NACL 20-0.9 MEQ/L-% IV SOLN
INTRAVENOUS | Status: DC
  Filled 2023-04-12: qty 1000

## 2023-04-12 MED ORDER — SODIUM CHLORIDE 0.9 % IV SOLN
500.0000 mg | INTRAVENOUS | Status: DC
  Administered 2023-04-12: 500 mg via INTRAVENOUS
  Filled 2023-04-12: qty 5

## 2023-04-12 MED ORDER — APIXABAN 5 MG PO TABS
5.0000 mg | ORAL_TABLET | Freq: Two times a day (BID) | ORAL | Status: DC
  Administered 2023-04-13 (×2): 5 mg via ORAL
  Filled 2023-04-12 (×2): qty 1

## 2023-04-12 MED ORDER — SODIUM CHLORIDE 0.9 % IV SOLN
2.0000 g | INTRAVENOUS | Status: DC
  Administered 2023-04-12: 2 g via INTRAVENOUS
  Filled 2023-04-12: qty 20

## 2023-04-12 MED ORDER — TOPIRAMATE 25 MG PO TABS
200.0000 mg | ORAL_TABLET | Freq: Two times a day (BID) | ORAL | Status: DC
  Administered 2023-04-13 (×2): 200 mg via ORAL
  Filled 2023-04-12 (×2): qty 8

## 2023-04-12 MED ORDER — SACCHAROMYCES BOULARDII 250 MG PO CAPS
250.0000 mg | ORAL_CAPSULE | Freq: Two times a day (BID) | ORAL | Status: DC
  Administered 2023-04-13 (×2): 250 mg via ORAL
  Filled 2023-04-12 (×2): qty 1

## 2023-04-12 MED ORDER — ADULT MULTIVITAMIN W/MINERALS CH
1.0000 | ORAL_TABLET | Freq: Every day | ORAL | Status: DC
  Administered 2023-04-13: 1 via ORAL
  Filled 2023-04-12: qty 1

## 2023-04-12 MED ORDER — IOHEXOL 300 MG/ML  SOLN
100.0000 mL | Freq: Once | INTRAMUSCULAR | Status: AC | PRN
  Administered 2023-04-12: 100 mL via INTRAVENOUS

## 2023-04-12 MED ORDER — POTASSIUM CHLORIDE 20 MEQ PO PACK
20.0000 meq | PACK | Freq: Every day | ORAL | Status: DC
  Administered 2023-04-13: 20 meq via ORAL
  Filled 2023-04-12: qty 1

## 2023-04-12 MED ORDER — THIAMINE HCL 100 MG/ML IJ SOLN: 100.0000 mg | Freq: Every day | INTRAMUSCULAR | Status: DC

## 2023-04-12 MED ORDER — CHLORDIAZEPOXIDE HCL 5 MG PO CAPS: 5.0000 mg | ORAL_CAPSULE | ORAL | Status: DC

## 2023-04-12 MED ORDER — GABAPENTIN 300 MG PO CAPS
300.0000 mg | ORAL_CAPSULE | Freq: Three times a day (TID) | ORAL | Status: DC
  Administered 2023-04-13 (×2): 300 mg via ORAL
  Filled 2023-04-12 (×2): qty 1

## 2023-04-12 MED ORDER — POTASSIUM CHLORIDE 10 MEQ/100ML IV SOLN
10.0000 meq | INTRAVENOUS | Status: AC
  Administered 2023-04-12 – 2023-04-13 (×6): 10 meq via INTRAVENOUS
  Filled 2023-04-12 (×3): qty 100

## 2023-04-12 MED ORDER — GUAIFENESIN ER 600 MG PO TB12
600.0000 mg | ORAL_TABLET | Freq: Two times a day (BID) | ORAL | Status: DC
  Administered 2023-04-13 (×2): 600 mg via ORAL
  Filled 2023-04-12 (×2): qty 1

## 2023-04-12 MED ORDER — IPRATROPIUM-ALBUTEROL 0.5-2.5 (3) MG/3ML IN SOLN: 3.0000 mL | Freq: Four times a day (QID) | RESPIRATORY_TRACT | Status: DC | PRN

## 2023-04-12 MED ORDER — LORAZEPAM 1 MG PO TABS: 1.0000 mg | ORAL_TABLET | ORAL | Status: DC | PRN

## 2023-04-12 MED ORDER — CLOBAZAM 5 MG PO HALF TABLET
20.0000 mg | ORAL_TABLET | Freq: Every day | ORAL | Status: DC
Start: 2023-04-12 — End: 2023-04-12

## 2023-04-12 MED ORDER — ONDANSETRON HCL 4 MG/2ML IJ SOLN: 4.0000 mg | Freq: Four times a day (QID) | INTRAMUSCULAR | Status: DC | PRN

## 2023-04-12 MED ORDER — ACETAMINOPHEN 325 MG RE SUPP: 650.0000 mg | Freq: Four times a day (QID) | RECTAL | Status: DC | PRN

## 2023-04-12 MED ORDER — SODIUM CHLORIDE 0.9 % IV SOLN: 2.0000 g | INTRAVENOUS | Status: DC

## 2023-04-12 MED ORDER — THIAMINE MONONITRATE 100 MG PO TABS
100.0000 mg | ORAL_TABLET | Freq: Every day | ORAL | Status: DC
  Administered 2023-04-13: 100 mg via ORAL
  Filled 2023-04-12: qty 1

## 2023-04-12 MED ORDER — SODIUM CHLORIDE 0.9 % IV BOLUS (SEPSIS)
1000.0000 mL | Freq: Once | INTRAVENOUS | Status: AC
  Administered 2023-04-12: 1000 mL via INTRAVENOUS

## 2023-04-12 MED ORDER — ONDANSETRON HCL 4 MG PO TABS: 4.0000 mg | ORAL_TABLET | Freq: Four times a day (QID) | ORAL | Status: DC | PRN

## 2023-04-12 MED ORDER — ACETAMINOPHEN 325 MG PO TABS: 650.0000 mg | ORAL_TABLET | Freq: Four times a day (QID) | ORAL | Status: DC | PRN

## 2023-04-12 MED ORDER — NALOXONE HCL 4 MG/0.1ML NA LIQD: 1.0000 | Freq: Once | NASAL | Status: DC | PRN

## 2023-04-12 NOTE — Assessment & Plan Note (Addendum)
-   This includes cannabinoids, opiates and alcohol. - She may benefit from inpatient psychiatry consult. - She will be monitored for withdrawal.

## 2023-04-12 NOTE — H&P (Addendum)
[place]   PATIENT NAME: Leslie [name]    MR#:  [id number]  DATE OF BIRTH:  June 12, 1976  DATE OF ADMISSION:  04/12/2023  PRIMARY CARE PHYSICIAN: Rolm Gala, MD   Patient is coming from: Home  REQUESTING/REFERRING PHYSICIAN: Alfonse Flavors, MD  CHIEF COMPLAINT:   Chief Complaint  Patient presents with   Altered Mental Status   Diarrhea    HISTORY OF PRESENT ILLNESS:  Leslie [name] is a 47 y.o. female with medical history significant for ITP, seizure disorder, alcohol abuse, subdural hemorrhage, depression and anxiety, presented to the ER with acute onset of altered mental status with confusion.  The patient's mother was trying to get in touch with her and could not and therefore she went to her.  She found her laying in bed soiled with urine and liquid stools and globally confused.  She denied drinking alcohol from a year ago though her alcohol levels 235.  She admitted to watery diarrhea recently with no blood in the stools or melena.  She denies any nausea or vomiting or abdominal pain.  No fever or chills.  No cough or wheezing or dyspnea.  No dysuria, oliguria or hematuria or flank pain.  She is not sure if she had seizures or not.  No paresthesias or focal muscle weakness.  She admitted to taking recent antibiotics however could not tell me what antibiotic or when it was done.  She was not having significant cough or wheezing or dyspnea.  ED Course: When she came to the ER, vital signs were within normal.  Labs revealed anemia with hemoglobin 11.9 medical 34.6 and macrocytosis.  Lactic acid was 3.6.  CMP was remarkable for severe hypokalemia with potassium less than 2 and CO2 21 calcium 7.2 albumin 2.5 with total protein 5.7.  AST was 52 alk phos 120.  Tylenol level was less than 10 and salicylate less than 7. EKG as reviewed by me : I know this is like filing for there if it is EKG showed no sinus rhythm with a rate of 77 with borderline prolonged QT interval with  QTc of 502 MS. Imaging: Portable chest ray showed asymmetric hazy opacity in the left thorax that could be related to rotation or overlying soft tissue with no active disease. Chest ,abdomen and pelvis CT with contrast revealed the following: 1. Suspicion of subtle ground-glass density within the bilateral lungs, most evident in the right upper lobe, as may be seen with respiratory infection/pneumonia. 2. Postsurgical changes of the stomach and small bowel likely related to prior gastric bypass surgery. Fluid-filled mildly distended small bowel scattered throughout the abdomen without well-defined transition point. Suspected areas of small-bowel wall thickening and suspected diffuse colon wall thickening. Findings are suggestive of enterocolitis. 3. Hepatic steatosis.  The patient was given 1 L bolus of IV normal saline, 16, and potassium chloride, IV Rocephin and Zithromax.  She will be admitted to a medical telemetry bed for further evaluation and management.  PAST MEDICAL HISTORY:   Past Medical History:  Diagnosis Date   Anemia    Anxiety    Epilepsy (HCC)    ITP (idiopathic thrombocytopenic purpura)    Obese   -History of blood clots  PAST SURGICAL HISTORY:   Past Surgical History:  Procedure Laterality Date   ANKLE ARTHROSCOPY     CHOLECYSTECTOMY     GASTRIC BYPASS  2016   KNEE ARTHROSCOPY     OPEN REDUCTION PROXIMAL HUMERUS FRACTURE  Left 3/218  SPLENECTOMY, TOTAL  2006   for ITP   TONSILLECTOMY     TUBAL LIGATION  2009    SOCIAL HISTORY:   Social History   Tobacco Use   Smoking status: Former    Current packs/day: 0.00    Types: Cigarettes    Quit date: 03/21/2013    Years since quitting: 10.0   Smokeless tobacco: Never  Substance Use Topics   Alcohol use: Yes    FAMILY HISTORY:   Family History  Problem Relation Age of Onset   Diabetes Father    Hypertension Father    Brain cancer Father        GBM   Hypertension Mother    Drug abuse Sister      DRUG ALLERGIES:   Allergies  Allergen Reactions   Bupropion Other (See Comments) and Rash    H/o seizures  Contraindicated due to seizures  Seizures   Ketamine Other (See Comments)    Night terrors   Paroxetine Other (See Comments)    H/O ITP, bleeding risk   Rituximab Other (See Comments)    Patient developed seizures; this happened previously with rituximab in 2014   Sodium Ferric Gluconate [Ferrous Gluconate] Anaphylaxis   Ace Inhibitors Cough   Amoxicillin-Pot Clavulanate Rash   Venlafaxine Rash    Didn't work Didn't work Didn't work    REVIEW OF SYSTEMS:   ROS As per history of present illness. All pertinent systems were reviewed above. Constitutional, HEENT, cardiovascular, respiratory, GI, GU, musculoskeletal, neuro, psychiatric, endocrine, integumentary and hematologic systems were reviewed and are otherwise negative/unremarkable except for positive findings mentioned above in the HPI.   MEDICATIONS AT HOME:   Prior to Admission medications   Medication Sig Start Date End Date Taking? Authorizing Provider  acetaminophen (TYLENOL) 325 MG tablet Take 325 mg by mouth every 12 (twelve) hours. Take 2 tablets twice a day    [provider]  buprenorphine (SUBUTEX) 2 MG SUBL SL tablet Place 2 mg under the tongue daily. 09/03/20   [provider]  chlordiazePOXIDE (LIBRIUM) 10 MG capsule Take 3 tablets by mouth daily x2 days; then take 2 tablets by mouth daily x3 days; then take 1 tablet by mouth daily x3 days; then take half tablet by mouth daily x3 days and is still bleeding. 08/27/20   Vassie Loll, MD  cloBAZam (ONFI) 10 MG tablet Take 20 mg by mouth. Take 20 mg at night    [provider]  cyanocobalamin (,VITAMIN B-12,) 1000 MCG/ML injection Inject 1 mL daily for 7 days; then weekly for 1 month and then monthly after that until follow-up with PCP and further instructions provided. 08/27/20   Vassie Loll, MD  EPINEPHrine 0.3 mg/0.3 mL IJ  SOAJ injection Inject into the muscle.    [provider]  fostamatinib disodium (TAVALISSE) 100 MG tablet Take 100 mg by mouth 2 (two) times daily.    [provider]  gabapentin (NEURONTIN) 300 MG capsule Take by mouth. Take 300mg  in the morning, take 300mg  in the afternoon, and take 600 mg at night 03/10/19   [provider]  ipratropium (ATROVENT) 0.03 % nasal spray Place 2 sprays into both nostrils every 12 (twelve) hours. 01/04/23   Viviano Simas, FNP  magnesium oxide (MAG-OX) 400 MG tablet Take 1 tablet (400 mg total) by mouth daily. 08/27/20   Vassie Loll, MD  naloxone [facility]) nasal spray 4 mg/0.1 mL One spray in nostril if patient is not breathing; call 911; if needed repeat  after 2 minutes 04/06/18   [provider]  NAYZILAM 5 MG/0.1ML SOLN Administer as needed 08/08/19   [provider]  norelgestromin-ethinyl estradiol (ORTHO EVRA) 150-35 MCG/24HR transdermal patch Place 1 patch onto the skin once a week. 03/19/20 03/19/21  [provider]  ondansetron (ZOFRAN-ODT) 4 MG disintegrating tablet Take 4 mg by mouth daily as needed. 05/15/19   [provider]  oxyCODONE (OXY IR/ROXICODONE) 5 MG immediate release tablet Take 5 mg by mouth 3 (three) times daily as needed. 06/06/19   [provider]  polyethylene glycol (MIRALAX / GLYCOLAX) 17 g packet Take 17 g by mouth daily as needed.    [provider]  potassium chloride (KLOR-CON) 20 MEQ packet Take 20 mEq by mouth daily.    [provider]  Pramoxine-HC (HYDROCORTISONE ACE-PRAMOXINE) 2.5-1 % CREA Apply three times daily as needed 03/07/19   [provider]  PROMACTA 50 MG tablet Take 100 mg by mouth daily. Patient not taking: No sig reported 04/27/19   [provider]  saccharomyces boulardii (FLORASTOR) 250 MG capsule Take 1 capsule (250 mg total) by mouth 2 (two) times daily. 08/27/20   Vassie Loll, MD  topiramate (TOPAMAX) 200 MG  tablet Take 200 mg by mouth 2 (two) times daily.    [provider]  TRINTELLIX 10 MG TABS tablet TAKE 1 AND 1/2 TABLETS BY MOUTH EVERY DAY Patient not taking: Reported on 08/26/2020 06/10/20   Jomarie Longs, MD  vitamin B-12 (CYANOCOBALAMIN) 1000 MCG tablet Take 1,000 mcg by mouth daily. 12/30/17   [provider]      VITAL SIGNS:  Blood pressure 110/62, pulse 85, temperature 98.3 F (36.8 C), temperature source Oral, resp. rate 12, SpO2 100%.  PHYSICAL EXAMINATION:  Physical Exam  GENERAL:  47 y.o.-year-old Caucasian female patient lying in the bed with no acute distress.  EYES: Pupils equal, round, reactive to light and accommodation. No scleral icterus. Extraocular muscles intact.  HEENT: Head atraumatic, normocephalic. Oropharynx and nasopharynx clear.  NECK:  Supple, no jugular venous distention. No thyroid enlargement, no tenderness.  LUNGS: Normal breath sounds bilaterally, no wheezing, rales,rhonchi or crepitation. No use of accessory muscles of respiration.  CARDIOVASCULAR: Regular rate and rhythm, S1, S2 normal. No murmurs, rubs, or gallops.  ABDOMEN: Soft, nondistended, nontender. Bowel sounds present. No organomegaly or mass.  EXTREMITIES: No pedal edema, cyanosis, or clubbing.  NEUROLOGIC: Cranial nerves II through XII are intact. Muscle strength 5/5 in all extremities. Sensation intact. Gait not checked.  PSYCHIATRIC: The patient is alert and oriented x 3 with clean orientation to year but not to month or day.  Normal affect and good eye contact. SKIN: No obvious rash, lesion, or ulcer.   LABORATORY PANEL:   CBC Recent Labs  Lab 04/12/23 1715  WBC 6.2  HGB 11.9*  HCT 34.6*  PLT 165   ------------------------------------------------------------------------------------------------------------------  Chemistries  Recent Labs  Lab 04/12/23 1715  NA 140  K <2.0*  CL 106  CO2 21*  GLUCOSE 96  BUN 10  CREATININE 0.49  CALCIUM 7.2*  AST 52*   ALT 26  ALKPHOS 130*  BILITOT 0.8   ------------------------------------------------------------------------------------------------------------------  Cardiac Enzymes No results for input(s): "TROPONINI" in the last 168 hours. ------------------------------------------------------------------------------------------------------------------  RADIOLOGY:  CT CHEST ABDOMEN PELVIS W CONTRAST  Result Date: 04/12/2023 CLINICAL DATA:  Possible sepsis altered mental status EXAM: CT CHEST, ABDOMEN, AND PELVIS WITH CONTRAST TECHNIQUE: Multidetector CT imaging of the chest, abdomen and pelvis was performed following the standard protocol  during bolus administration of intravenous contrast. RADIATION DOSE REDUCTION: This exam was performed according to the departmental dose-optimization program which includes automated exposure control, adjustment of the mA and/or kV according to patient size and/or use of iterative reconstruction technique. CONTRAST:  OMNIPAQUE IOHEXOL 300 MG/ML  SOLN COMPARISON:  CT 08/25/2020 FINDINGS: CT CHEST FINDINGS Cardiovascular: Nonaneurysmal aorta. Right-sided central venous port with tip at the SVC. Normal cardiac size. No pericardial effusion. Mediastinum/Nodes: Midline trachea. No thyroid mass. No suspicious lymph nodes. Esophagus within normal limits. Lungs/Pleura: No pleural effusion or pneumothorax. Suspicion of subtle ground-glass density within the bilateral lungs, most evident in the right upper lobe, for example series 4, image 35. Musculoskeletal: No acute osseous abnormality. CT ABDOMEN PELVIS FINDINGS Hepatobiliary: Hepatic steatosis. Cholecystectomy. No biliary dilatation Pancreas: Unremarkable. No pancreatic ductal dilatation or surrounding inflammatory changes. Spleen: Splenectomy Adrenals/Urinary Tract: Adrenal glands are within normal limits. Kidneys show no hydronephrosis. The bladder is unremarkable. Stomach/Bowel: Postsurgical changes of the stomach and  small bowel likely related to prior gastric bypass surgery. Fluid-filled mildly distended small bowel scattered throughout the abdomen without well-defined transition point. Suspected areas of small-bowel wall thickening. Suspected diffuse colon wall thickening. Negative appendix. Vascular/Lymphatic: No significant vascular findings are present. No enlarged abdominal or pelvic lymph nodes. Reproductive: Uterus and bilateral adnexa are unremarkable. Other: Negative for pelvic effusion or free air. Musculoskeletal: Chronic compression deformity at L1. No acute osseous abnormality IMPRESSION: 1. Suspicion of subtle ground-glass density within the bilateral lungs, most evident in the right upper lobe, as may be seen with respiratory infection/pneumonia. 2. Postsurgical changes of the stomach and small bowel likely related to prior gastric bypass surgery. Fluid-filled mildly distended small bowel scattered throughout the abdomen without well-defined transition point. Suspected areas of small-bowel wall thickening and suspected diffuse colon wall thickening. Findings are suggestive of enterocolitis. 3. Hepatic steatosis. Electronically Signed   By: Jasmine Pang M.D.   On: 04/12/2023 21:14   CT Head Wo Contrast  Result Date: 04/12/2023 CLINICAL DATA:  Mental status change EXAM: CT HEAD WITHOUT CONTRAST TECHNIQUE: Contiguous axial images were obtained from the base of the skull through the vertex without intravenous contrast. RADIATION DOSE REDUCTION: This exam was performed according to the departmental dose-optimization program which includes automated exposure control, adjustment of the mA and/or kV according to patient size and/or use of iterative reconstruction technique. COMPARISON:  CT brain 08/25/2020 FINDINGS: Brain: No evidence of acute infarction, hemorrhage, hydrocephalus, extra-axial collection or mass lesion/mass effect. Vascular: No hyperdense vessel or unexpected calcification. Skull: Normal. Negative  for fracture or focal lesion. Sinuses/Orbits: No acute finding. Other: None IMPRESSION: Negative non contrasted CT appearance of the brain. Electronically Signed   By: Jasmine Pang M.D.   On: 04/12/2023 21:04   DG Chest Port 1 View  Result Date: 04/12/2023 CLINICAL DATA:  Possible sepsis EXAM: PORTABLE CHEST 1 VIEW COMPARISON:  08/25/2020 FINDINGS: Right-sided central venous port tip over the SVC. Hardware in the left humerus. Asymmetric hazy opacity left thorax could be due to rotation and overlying soft tissue. No consolidation, pleural effusion, or pneumothorax. Normal cardiac size. IMPRESSION: No active disease. Asymmetric hazy opacity left thorax could be due to rotation and overlying soft tissue. Electronically Signed   By: Jasmine Pang M.D.   On: 04/12/2023 21:02      IMPRESSION AND PLAN:  Assessment and Plan: * Acute encephalopathy - This could be multifactorial in differential diagnoses include: Seizure disorder that could be related to alcohol withdrawal, sepsis, aspiration and hypoxia, alcohol  intoxication and arrhythmias given her severe hypokalemia. - She will be placed on seizure precautions and will continue her antiseizure therapy. - Will obtain EEG and neurology consult. - I notified Dr. Selina Cooley about the patient.  CAP (community acquired pneumonia) - This could very well be related to aspiration pneumonia especially in the setting of alcohol intoxication likely in the setting of alcohol abuse. - The patient will be admitted to a medical telemetry bed. - Will continue antibiotic therapy with IV Rocephin and Zithromax. - Mucolytic therapy be provided as well as duo nebs q.i.d. and q.4 hours p.r.n. - We will follow blood cultures. - She was counseled for alcohol cessation. - Will be placed on aspiration precautions.  Intractable diarrhea - Stool studies and C. difficile are pending. - She will be hydrated with IV normal saline with added potassium  chloride.  Hypokalemia - This is severe. - Potassium will be aggressively replaced and magnesium level will be checked.  Seizure disorder (HCC) - We will continue her antiseizure medications. - She be placed on as needed IV Ativan with CIWA protocol as well.  Depression - We will continue her Trintellix.  Chronic ITP (idiopathic thrombocytopenia) (HCC) - We will resume her fostamatinib. - Platelets count have been normal recently.  Polysubstance abuse (HCC) - This includes cannabinoids and alcohol. - She may benefit from inpatient psychiatry consult. - She will be monitored for withdrawal.   DVT prophylaxis: Eliquis.  Advanced Care Planning:  Code Status: full code.  Family Communication:  The plan of care was discussed in details with the patient (and family). I answered all questions. The patient agreed to proceed with the above mentioned plan. Further management will depend upon hospital course. Disposition Plan: Back to previous home environment Consults called: Neurology All the records are reviewed and case discussed with ED provider.  Status is: Inpatient     At the time of the admission, it appears that the appropriate admission status for this patient is inpatient.  This is judged to be reasonable and necessary in order to provide the required intensity of service to ensure the patient's safety given the presenting symptoms, physical exam findings and initial radiographic and laboratory data in the context of comorbid conditions.  The patient requires inpatient status due to high intensity of service, high risk of further deterioration and high frequency of surveillance required.  I certify that at the time of admission, it is my clinical judgment that the patient will require inpatient hospital care extending more than 2 midnights.                            Dispo: The patient is from: Home              Anticipated d/c is to: Home              Patient currently is  not medically stable to d/c.              Difficult to place patient: No  Hannah Beat M.D on 04/12/2023 at 11:20 PM  [facility]   From 7 PM-7 AM, contact night-coverage [email or web address]  CC: Primary care physician; Rolm Gala, MD

## 2023-04-12 NOTE — ED Provider Notes (Signed)
[facility] Provider Note    Event Date/Time   First MD Initiated Contact with Patient 04/12/23 1654     (approximate)   History   Chief Complaint: Altered mental status  HPI  Leslie [name] is a 47 y.o. female with a history of obesity, epilepsy, subdural hemorrhage, substance abuse who was brought to the ED due to altered mental status.  Mother had not heard from the patient on the phone for a few days so she went to check on her, found her laying in bed which was soiled with urine and liquid stool, patient confused.       Physical Exam   Triage Vital Signs: ED Triage Vitals  Encounter Vitals Group     BP      Systolic BP Percentile      Diastolic BP Percentile      Pulse      Resp      Temp      Temp src      SpO2      Weight      Height      Head Circumference      Peak Flow      Pain Score      Pain Loc      Pain Education      Exclude from Growth Chart     Most recent vital signs: Vitals:   04/12/23 1915 04/12/23 2100  BP:  110/62  Pulse: 75 85  Resp: (!) 22 12  Temp:    SpO2: 94% 100%    General: Awake, no distress.  Not oriented. CV:  Good peripheral perfusion.  Regular rate and rhythm Resp:  Normal effort.  Clear to auscultation bilaterally Abd:  No distention.  Soft nontender Other:  No signs of trauma.  Cranial nerves II through XII intact.  No focal motor drift.   ED Results / Procedures / Treatments   Labs (all labs ordered are listed, but only abnormal results are displayed) Labs Reviewed  LACTIC ACID, PLASMA - Abnormal; Notable for the following components:      Result Value   Lactic Acid, Venous 3.6 (*)    All other components within normal limits  COMPREHENSIVE METABOLIC PANEL - Abnormal; Notable for the following components:   Potassium <2.0 (*)    CO2 21 (*)    Calcium 7.2 (*)    Total Protein 5.7 (*)    Albumin 2.5 (*)    AST 52 (*)    Alkaline Phosphatase 130 (*)    All other components within  normal limits  CBC WITH DIFFERENTIAL/PLATELET - Abnormal; Notable for the following components:   RBC 3.38 (*)    Hemoglobin 11.9 (*)    HCT 34.6 (*)    MCV 102.4 (*)    MCH 35.2 (*)    All other components within normal limits  BLOOD GAS, VENOUS - Abnormal; Notable for the following components:   pH, Ven 7.44 (*)    pCO2, Ven 33 (*)    All other components within normal limits  URINALYSIS, W/ REFLEX TO CULTURE (INFECTION SUSPECTED) - Abnormal; Notable for the following components:   Color, Urine YELLOW (*)    APPearance HAZY (*)    Ketones, ur 20 (*)    All other components within normal limits  ACETAMINOPHEN LEVEL - Abnormal; Notable for the following components:   Acetaminophen (Tylenol), Serum <10 (*)    All other components within normal limits  SALICYLATE LEVEL -  Abnormal; Notable for the following components:   Salicylate Lvl <7.0 (*)    All other components within normal limits  URINE DRUG SCREEN, QUALITATIVE ([facility] ONLY) - Abnormal; Notable for the following components:   Tricyclic, Ur Screen POSITIVE (*)    Cannabinoid 50 Ng, Ur  POSITIVE (*)    Benzodiazepine, Ur Scrn POSITIVE (*)    All other components within normal limits  ETHANOL - Abnormal; Notable for the following components:   Alcohol, Ethyl (B) 235 (*)    All other components within normal limits  RESP PANEL BY RT-PCR (RSV, FLU A&B, COVID)  [id number]  CULTURE, BLOOD (ROUTINE X 2)  CULTURE, BLOOD (ROUTINE X 2)  C DIFFICILE QUICK SCREEN W PCR REFLEX    GASTROINTESTINAL PANEL BY PCR, STOOL (REPLACES STOOL CULTURE)  AMMONIA  LACTIC ACID, PLASMA  PREGNANCY, URINE     EKG Interpreted by me Sinus rhythm rate of 77.  Normal axis intervals QRS ST segments T waves   RADIOLOGY Chest x-ray interpreted by me, no focal infiltrates.  Radiology report reviewed.  CT head unremarkable  CT chest abdomen pelvis shows multifocal infiltrates in right lung concerning for pneumonia   PROCEDURES:  .Critical  Care  Performed by: Sharman Cheek, MD Authorized by: Sharman Cheek, MD   Critical care provider statement:    Critical care time (minutes):  35   Critical care time was exclusive of:  Separately billable procedures and treating other patients   Critical care was necessary to treat or prevent imminent or life-threatening deterioration of the following conditions:  CNS failure or compromise, metabolic crisis and dehydration   Critical care was time spent personally by me on the following activities:  Development of treatment plan with patient or surrogate, discussions with consultants, evaluation of patient's response to treatment, examination of patient, obtaining history from patient or surrogate, ordering and performing treatments and interventions, ordering and review of laboratory studies, ordering and review of radiographic studies, pulse oximetry, re-evaluation of patient's condition and review of old charts   Care discussed with: admitting provider      MEDICATIONS ORDERED IN ED: Medications  cefTRIAXone (ROCEPHIN) 2 g in sodium chloride 0.9 % 100 mL IVPB (0 g Intravenous Stopped 04/12/23 1946)  azithromycin (ZITHROMAX) 500 mg in sodium chloride 0.9 % 250 mL IVPB (0 mg Intravenous Stopped 04/12/23 2048)  potassium chloride 10 mEq in 100 mL IVPB (10 mEq Intravenous New Bag/Given 04/12/23 2127)  sodium chloride 0.9 % bolus 1,000 mL (1,000 mLs Intravenous New Bag/Given 04/12/23 1821)  iohexol (OMNIPAQUE) 300 MG/ML solution 100 mL (100 mLs Intravenous Contrast Given 04/12/23 2037)     IMPRESSION / MDM / ASSESSMENT AND PLAN / ED COURSE  I reviewed the triage vital signs and the nursing notes.  DDx: Dehydration, intracranial hemorrhage, electrolyte abnormality, anemia, intoxication, UTI, AKI, C. difficile colitis  Patient's presentation is most consistent with acute presentation with potential threat to life or bodily function.  Patient presents with profound fatigue,  encephalopathy, diarrhea.  She was hospitalized 1 month ago and has risk of C. difficile infection.  She is maintaining her airway, not vomiting.  Labs reveal profound hypokalemia, alcohol level of 230.  Lactic acid levels elevated at 3.6.  Imaging concerning for pneumonia.  IV fluids given, Rocephin and azithromycin given.  Port accessed for IV potassium runs.  Will need to admit for further management.       FINAL CLINICAL IMPRESSION(S) / ED DIAGNOSES   Final diagnoses:  Acute encephalopathy  Diarrhea, unspecified  type  Hypokalemia     Rx / DC Orders   ED Discharge Orders     None        Note:  This document was prepared using Dragon voice recognition software and may include unintentional dictation errors.   Sharman Cheek, MD 04/12/23 2152

## 2023-04-12 NOTE — Assessment & Plan Note (Signed)
-   This could very well be related to aspiration pneumonia especially in the setting of alcohol intoxication likely in the setting of alcohol abuse. - The patient will be admitted to a medical telemetry bed. - Will continue antibiotic therapy with IV Rocephin and Zithromax. - Mucolytic therapy be provided as well as duo nebs q.i.d. and q.4 hours p.r.n. - We will follow blood cultures. - She was counseled for alcohol cessation. - Will be placed on aspiration precautions.

## 2023-04-12 NOTE — Assessment & Plan Note (Signed)
-   We will continue her Trintellix.

## 2023-04-12 NOTE — Sepsis Progress Note (Signed)
Sepsis protocol monitored by eLink ?

## 2023-04-12 NOTE — Assessment & Plan Note (Signed)
-   This is severe. - Potassium will be aggressively replaced and magnesium level will be checked.

## 2023-04-12 NOTE — ED Triage Notes (Signed)
Pt arrived via EMS. Pt's mom found her at home with altered mental status and covered in diarrhea. 640 mg of tylenol were given by EMS. Hx of blood clots. Takes eliquis.

## 2023-04-12 NOTE — Assessment & Plan Note (Deleted)
-   This could very well be related to aspiration pneumonia especially in the setting of alcohol intoxication likely in the setting of alcohol abuse. - The patient will be admitted to a medical telemetry bed. - Will continue antibiotic therapy with IV Rocephin and Zithromax. - Mucolytic therapy be provided as well as duo nebs q.i.d. and q.4 hours p.r.n. - We will follow blood cultures. - She was counseled for alcohol cessation. - Will be placed on aspiration precautions.

## 2023-04-12 NOTE — Assessment & Plan Note (Signed)
-   Stool studies and C. difficile are pending. - She will be hydrated with IV normal saline with added potassium chloride.

## 2023-04-12 NOTE — Assessment & Plan Note (Signed)
-   We will continue her antiseizure medications. - She be placed on as needed IV Ativan with CIWA protocol as well.

## 2023-04-12 NOTE — Assessment & Plan Note (Addendum)
-   We will resume her fostamatinib. - Platelets count have been normal recently.

## 2023-04-12 NOTE — Progress Notes (Signed)
CODE SEPSIS - PHARMACY COMMUNICATION  **Broad Spectrum Antibiotics should be administered within 1 hour of Sepsis diagnosis**  Time Code Sepsis Called/Page Received: 1703  Antibiotics Ordered: ceftriaxone and azithromcyin  Time of 1st antibiotic administration: 1746  Additional action taken by pharmacy: none  If necessary, Name of Provider/Nurse Contacted: n/a    Elliot Gurney, PharmD, BCPS Clinical Pharmacist  04/12/2023 5:06 PM

## 2023-04-12 NOTE — Assessment & Plan Note (Signed)
-   This could be multifactorial in differential diagnoses include: Seizure disorder that could be related to alcohol withdrawal, sepsis, aspiration and hypoxia, alcohol intoxication and arrhythmias given her severe hypokalemia. - She will be placed on seizure precautions and will continue her antiseizure therapy. - Will obtain EEG and neurology consult. - I notified Dr. Selina Cooley about the patient.

## 2023-04-13 DIAGNOSIS — G9340 Encephalopathy, unspecified: Secondary | ICD-10-CM | POA: Diagnosis not present

## 2023-04-13 DIAGNOSIS — F10921 Alcohol use, unspecified with intoxication delirium: Secondary | ICD-10-CM | POA: Diagnosis not present

## 2023-04-13 LAB — CBC
HCT: 33.5 % — ABNORMAL LOW (ref 36.0–46.0)
Hemoglobin: 11.7 g/dL — ABNORMAL LOW (ref 12.0–15.0)
MCH: 35.2 pg — ABNORMAL HIGH (ref 26.0–34.0)
MCHC: 34.9 g/dL (ref 30.0–36.0)
MCV: 100.9 fL — ABNORMAL HIGH (ref 80.0–100.0)
Platelets: 184 10*3/uL (ref 150–400)
RBC: 3.32 MIL/uL — ABNORMAL LOW (ref 3.87–5.11)
RDW: 14 % (ref 11.5–15.5)
WBC: 8.4 10*3/uL (ref 4.0–10.5)
nRBC: 0.2 % (ref 0.0–0.2)

## 2023-04-13 LAB — BASIC METABOLIC PANEL
Anion gap: 11 (ref 5–15)
BUN: 12 mg/dL (ref 6–20)
CO2: 22 mmol/L (ref 22–32)
Calcium: 7.3 mg/dL — ABNORMAL LOW (ref 8.9–10.3)
Chloride: 105 mmol/L (ref 98–111)
Creatinine, Ser: 0.46 mg/dL (ref 0.44–1.00)
GFR, Estimated: >60 mL/min (ref >60)
Glucose, Bld: 106 mg/dL — ABNORMAL HIGH (ref 70–99)
Potassium: 2.8 mmol/L — ABNORMAL LOW (ref 3.5–5.1)
Sodium: 138 mmol/L (ref 135–145)

## 2023-04-13 LAB — HIV ANTIBODY (ROUTINE TESTING W REFLEX): HIV Screen 4th Generation wRfx: NONREACTIVE

## 2023-04-13 LAB — LACTIC ACID, PLASMA: Lactic Acid, Venous: 1.9 mmol/L (ref 0.5–1.9)

## 2023-04-13 MED ORDER — HEPARIN SOD (PORK) LOCK FLUSH 100 UNIT/ML IV SOLN
INTRAVENOUS | Status: AC
  Filled 2023-04-13: qty 5

## 2023-04-13 MED ORDER — HEPARIN SOD (PORK) LOCK FLUSH 10 UNIT/ML IV SOLN
10.0000 [IU] | Freq: Once | INTRAVENOUS | Status: AC
  Administered 2023-04-13: 10 [IU]

## 2023-04-13 NOTE — ED Notes (Signed)
This RN secure messaged and had secretary page admission MD due to pt wanting to leave AMA. Pt is very adamant to leave AMA and is not wanting to speak with MD prior to leaving.

## 2023-04-13 NOTE — ED Notes (Addendum)
Pt agreed to sign hard copy AMA form. Pt educated by this RN and Actuary about leaving without completing treatment. Pt verbalized understanding that her condition could worsen and even possibly cause death. Pt educated about MD recommendations to complete treatment. Pt alert and oriented x4. Port de-accessed and PIV removed. Pt wheeled out to lobby with mother.

## 2023-04-13 NOTE — ED Notes (Signed)
Family at bedside. 

## 2023-04-13 NOTE — ED Notes (Signed)
Lab at bedside to collect morning labs.

## 2023-04-13 NOTE — ED Notes (Signed)
Lab called to collect labs  

## 2023-04-13 NOTE — ED Notes (Signed)
Pt refusing vital signs at discharge
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
## 2023-04-13 NOTE — ED Notes (Signed)
Pt ambulatory to bathroom and assisted back to bed by self. Pt hit call bell for RN to place back on monitor.

## 2023-04-13 NOTE — Discharge Summary (Signed)
Physician Discharge Summary  Leslie [name] ZOX:[id number] DOB: 12-15-1975 DOA: 04/12/2023  PCP: Leslie Gala, MD  Admit date: 04/12/2023 Discharge date: 04/13/2023  Admitted From: home  Disposition:  Pt left AMA   Recommendations for Outpatient Follow-up:  Pt left AMA   Home Health: no  Equipment/Devices: no   Discharge Condition: guarded  CODE STATUS: full  Diet recommendation:  pt left AMA   Brief/Interim Summary:   HPI was taken from Dr. Arville Care: SERIN [name] is a 47 y.o. female with medical history significant for ITP, seizure disorder, alcohol abuse, subdural hemorrhage, depression and anxiety, presented to the ER with acute onset of altered mental status with confusion.  The patient's mother was trying to get in touch with her and could not and therefore she went to her.  She found her laying in bed soiled with urine and liquid stools and globally confused.  She denied drinking alcohol from a year ago though her alcohol levels 235.  She admitted to watery diarrhea recently with no blood in the stools or melena.  She denies any nausea or vomiting or abdominal pain.  No fever or chills.  No cough or wheezing or dyspnea.  No dysuria, oliguria or hematuria or flank pain.  She is not sure if she had seizures or not.  No paresthesias or focal muscle weakness.  She admitted to taking recent antibiotics however could not tell me what antibiotic or when it was done.  She was not having significant cough or wheezing or dyspnea.   ED Course: When she came to the ER, vital signs were within normal.  Labs revealed anemia with hemoglobin 11.9 medical 34.6 and macrocytosis.  Lactic acid was 3.6.  CMP was remarkable for severe hypokalemia with potassium less than 2 and CO2 21 calcium 7.2 albumin 2.5 with total protein 5.7.  AST was 52 alk phos 120.  Tylenol level was less than 10 and salicylate less than 7. EKG as reviewed by me : I know this is like filing for there if it is EKG showed no  sinus rhythm with a rate of 77 with borderline prolonged QT interval with QTc of 502 MS. Imaging: Portable chest ray showed asymmetric hazy opacity in the left thorax that could be related to rotation or overlying soft tissue with no active disease. Chest ,abdomen and pelvis CT with contrast revealed the following: 1. Suspicion of subtle ground-glass density within the bilateral lungs, most evident in the right upper lobe, as may be seen with respiratory infection/pneumonia. 2. Postsurgical changes of the stomach and small bowel likely related to prior gastric bypass surgery. Fluid-filled mildly distended small bowel scattered throughout the abdomen without well-defined transition point. Suspected areas of small-bowel wall thickening and suspected diffuse colon wall thickening. Findings are suggestive of enterocolitis. 3. Hepatic steatosis.   The patient was given 1 L bolus of IV normal saline, 16, and potassium chloride, IV Rocephin and Zithromax.  She will be admitted to a medical telemetry bed for further evaluation and management.   Pt decided to leave AMA. Pt was AA&Ox4.   Discharge Diagnoses:  Principal Problem:   Acute encephalopathy Active Problems:   CAP (community acquired pneumonia)   Intractable diarrhea   Hypokalemia   Seizure disorder (HCC)   Polysubstance abuse (HCC)   Chronic ITP (idiopathic thrombocytopenia) (HCC)   Depression Acute encephalopathy: likely secondary alcohol intoxication, severe hypokalemia. EEG ordered. AA&X4. Neuro consulted    CAP: continue on IV rocephin, azithromycin, bronchodilators. Encourage incentive spirometry  Intractable diarrhea: GI PCR panel ordered & c. diff ordered but not collected yet. Continue on IVFs   Hypokalemia: potassium given. Mg was WNL    Seizure disorder: continue on home dose of topiramate fycompa    Depression: severity unknown. Continue on home dose of vortioxetine    Chronic ITP: continue on home dose of  fostamatinib    Polysubstance abuse: urine drug screen was positive for benzos, marijuana & TCAs. Alcohol level was 235. Illicit drug use cessation counseling & alcohol use cessation counseling    Discharge Instructions  Pt left AMA    Allergies  Allergen Reactions   Bupropion Other (See Comments) and Rash    H/o seizures  Contraindicated due to seizures  Seizures   Ketamine Other (See Comments)    Night terrors   Paroxetine Other (See Comments)    H/O ITP, bleeding risk   Rituximab Other (See Comments)    Patient developed seizures; this happened previously with rituximab in 2014   Sodium Ferric Gluconate [Ferrous Gluconate] Anaphylaxis   Ace Inhibitors Cough   Amoxicillin-Pot Clavulanate Rash   Venlafaxine Rash    Didn't work Didn't work Didn't work    Consultations: Neuro    Procedures/Studies: CT CHEST ABDOMEN PELVIS W CONTRAST  Result Date: 04/12/2023 CLINICAL DATA:  Possible sepsis altered mental status EXAM: CT CHEST, ABDOMEN, AND PELVIS WITH CONTRAST TECHNIQUE: Multidetector CT imaging of the chest, abdomen and pelvis was performed following the standard protocol during bolus administration of intravenous contrast. RADIATION DOSE REDUCTION: This exam was performed according to the departmental dose-optimization program which includes automated exposure control, adjustment of the mA and/or kV according to patient size and/or use of iterative reconstruction technique. CONTRAST:  OMNIPAQUE IOHEXOL 300 MG/ML  SOLN COMPARISON:  CT 08/25/2020 FINDINGS: CT CHEST FINDINGS Cardiovascular: Nonaneurysmal aorta. Right-sided central venous port with tip at the SVC. Normal cardiac size. No pericardial effusion. Mediastinum/Nodes: Midline trachea. No thyroid mass. No suspicious lymph nodes. Esophagus within normal limits. Lungs/Pleura: No pleural effusion or pneumothorax. Suspicion of subtle ground-glass density within the bilateral lungs, most evident in the right upper lobe,  for example series 4, image 35. Musculoskeletal: No acute osseous abnormality. CT ABDOMEN PELVIS FINDINGS Hepatobiliary: Hepatic steatosis. Cholecystectomy. No biliary dilatation Pancreas: Unremarkable. No pancreatic ductal dilatation or surrounding inflammatory changes. Spleen: Splenectomy Adrenals/Urinary Tract: Adrenal glands are within normal limits. Kidneys show no hydronephrosis. The bladder is unremarkable. Stomach/Bowel: Postsurgical changes of the stomach and small bowel likely related to prior gastric bypass surgery. Fluid-filled mildly distended small bowel scattered throughout the abdomen without well-defined transition point. Suspected areas of small-bowel wall thickening. Suspected diffuse colon wall thickening. Negative appendix. Vascular/Lymphatic: No significant vascular findings are present. No enlarged abdominal or pelvic lymph nodes. Reproductive: Uterus and bilateral adnexa are unremarkable. Other: Negative for pelvic effusion or free air. Musculoskeletal: Chronic compression deformity at L1. No acute osseous abnormality IMPRESSION: 1. Suspicion of subtle ground-glass density within the bilateral lungs, most evident in the right upper lobe, as may be seen with respiratory infection/pneumonia. 2. Postsurgical changes of the stomach and small bowel likely related to prior gastric bypass surgery. Fluid-filled mildly distended small bowel scattered throughout the abdomen without well-defined transition point. Suspected areas of small-bowel wall thickening and suspected diffuse colon wall thickening. Findings are suggestive of enterocolitis. 3. Hepatic steatosis. Electronically Signed   By: Jasmine Pang M.D.   On: 04/12/2023 21:14   CT Head Wo Contrast  Result Date: 04/12/2023 CLINICAL DATA:  Mental status change EXAM: CT  HEAD WITHOUT CONTRAST TECHNIQUE: Contiguous axial images were obtained from the base of the skull through the vertex without intravenous contrast. RADIATION DOSE REDUCTION:  This exam was performed according to the departmental dose-optimization program which includes automated exposure control, adjustment of the mA and/or kV according to patient size and/or use of iterative reconstruction technique. COMPARISON:  CT brain 08/25/2020 FINDINGS: Brain: No evidence of acute infarction, hemorrhage, hydrocephalus, extra-axial collection or mass lesion/mass effect. Vascular: No hyperdense vessel or unexpected calcification. Skull: Normal. Negative for fracture or focal lesion. Sinuses/Orbits: No acute finding. Other: None IMPRESSION: Negative non contrasted CT appearance of the brain. Electronically Signed   By: Jasmine Pang M.D.   On: 04/12/2023 21:04   DG Chest Port 1 View  Result Date: 04/12/2023 CLINICAL DATA:  Possible sepsis EXAM: PORTABLE CHEST 1 VIEW COMPARISON:  08/25/2020 FINDINGS: Right-sided central venous port tip over the SVC. Hardware in the left humerus. Asymmetric hazy opacity left thorax could be due to rotation and overlying soft tissue. No consolidation, pleural effusion, or pneumothorax. Normal cardiac size. IMPRESSION: No active disease. Asymmetric hazy opacity left thorax could be due to rotation and overlying soft tissue. Electronically Signed   By: Jasmine Pang M.D.   On: 04/12/2023 21:02   (Echo, Carotid, EGD, Colonoscopy, ERCP)    Subjective: Pt c/o malaise    Discharge Exam: Vitals:   04/13/23 1013 04/13/23 1110  BP:  (!) 141/83  Pulse:  90  Resp:  18  Temp: 98 F (36.7 C)   SpO2:  99%   Vitals:   04/13/23 0820 04/13/23 0830 04/13/23 1013 04/13/23 1110  BP: 125/72 134/68  (!) 141/83  Pulse: 86 93  90  Resp: 19 (!) 22  18  Temp:   98 F (36.7 C)   TempSrc:      SpO2: 100% 98%  99%    General: Pt is alert, awake, not in acute distress Cardiovascular: S1/S2 +, no rubs, no gallops Respiratory: decreased breath sounds b/l Abdominal: Soft, NT, ND, bowel sounds + Extremities:  no cyanosis    The results of significant diagnostics  from this hospitalization (including imaging, microbiology, ancillary and laboratory) are listed below for reference.     Microbiology: Recent Results (from the past 240 hour(s))  Blood Culture (routine x 2)     Status: None (Preliminary result)   Collection Time: 04/12/23  5:01 PM   Specimen: BLOOD  Result Value Ref Range Status   Specimen Description BLOOD BLOOD LEFT ARM  Final   Special Requests   Final    BOTTLES DRAWN AEROBIC AND ANAEROBIC Blood Culture adequate volume   Culture   Final    NO GROWTH < 12 HOURS Performed at [facility], [address], [place], [place] [address]    Report Status PENDING  Incomplete  Resp panel by RT-PCR (RSV, Flu A&B, Covid) Anterior Nasal Swab     Status: None   Collection Time: 04/12/23  5:15 PM   Specimen: Anterior Nasal Swab  Result Value Ref Range Status   SARS Coronavirus 2 by RT PCR NEGATIVE NEGATIVE Final    Comment: (NOTE) SARS-CoV-2 target nucleic acids are NOT DETECTED.  The SARS-CoV-2 RNA is generally detectable in upper respiratory specimens during the acute phase of infection. The lowest concentration of SARS-CoV-2 viral copies this assay can detect is 138 copies/mL. A negative result does not preclude SARS-Cov-2 infection and should not be used as the sole basis for treatment or other patient management decisions. A negative  result may occur with  improper specimen collection/handling, submission of specimen other than nasopharyngeal swab, presence of viral mutation(s) within the areas targeted by this assay, and inadequate number of viral copies(<138 copies/mL). A negative result must be combined with clinical observations, patient history, and epidemiological information. The expected result is Negative.  Fact Sheet for Patients:  BloggerCourse.com  Fact Sheet for Healthcare Providers:  SeriousBroker.it  This test is no t yet approved or cleared by the [place] FDA and  has been authorized for detection and/or diagnosis of SARS-CoV-2 by FDA under an Emergency Use Authorization (EUA). This EUA will remain  in effect (meaning this test can be used) for the duration of the COVID-19 declaration under Section 564(b)(1) of the Act, 21 U.S.C.section 360bbb-3(b)(1), unless the authorization is terminated  or revoked sooner.       Influenza A by PCR NEGATIVE NEGATIVE Final   Influenza B by PCR NEGATIVE NEGATIVE Final    Comment: (NOTE) The Xpert Xpress SARS-CoV-2/FLU/RSV plus assay is intended as an aid in the diagnosis of influenza from Nasopharyngeal swab specimens and should not be used as a sole basis for treatment. Nasal washings and aspirates are unacceptable for Xpert Xpress SARS-CoV-2/FLU/RSV testing.  Fact Sheet for Patients: BloggerCourse.com  Fact Sheet for Healthcare Providers: SeriousBroker.it  This test is not yet approved or cleared by the [place] FDA and has been authorized for detection and/or diagnosis of SARS-CoV-2 by FDA under an Emergency Use Authorization (EUA). This EUA will remain in effect (meaning this test can be used) for the duration of the COVID-19 declaration under Section 564(b)(1) of the Act, 21 U.S.C. section 360bbb-3(b)(1), unless the authorization is terminated or revoked.     Resp Syncytial Virus by PCR NEGATIVE NEGATIVE Final    Comment: (NOTE) Fact Sheet for Patients: BloggerCourse.com  Fact Sheet for Healthcare Providers: SeriousBroker.it  This test is not yet approved or cleared by the [place] FDA and has been authorized for detection and/or diagnosis of SARS-CoV-2 by FDA under an Emergency Use Authorization (EUA). This EUA will remain in effect (meaning this test can be used) for the duration of the COVID-19 declaration under Section 564(b)(1) of the Act, 21 U.S.C. section  360bbb-3(b)(1), unless the authorization is terminated or revoked.  Performed at [facility], [address] [place], [place] [address]   Blood Culture (routine x 2)     Status: None (Preliminary result)   Collection Time: 04/12/23  5:15 PM   Specimen: BLOOD  Result Value Ref Range Status   Specimen Description BLOOD BLOOD LEFT ARM  Final   Special Requests   Final    BOTTLES DRAWN AEROBIC AND ANAEROBIC Blood Culture adequate volume   Culture   Final    NO GROWTH < 12 HOURS Performed at [facility], [address], [place], [place] [address]    Report Status PENDING  Incomplete     Labs: BNP (last 3 results) No results for input(s): "BNP" in the last 8760 hours. Basic Metabolic Panel: Recent Labs  Lab 04/12/23 1715 04/13/23 0850  NA 140 138  K <2.0* 2.8*  CL 106 105  CO2 21* 22  GLUCOSE 96 106*  BUN 10 12  CREATININE 0.49 0.46  CALCIUM 7.2* 7.3*  MG 2.0  --    Liver Function Tests: Recent Labs  Lab 04/12/23 1715  AST 52*  ALT 26  ALKPHOS 130*  BILITOT 0.8  PROT 5.7*  ALBUMIN 2.5*   No results for input(s): "  LIPASE", "AMYLASE" in the last 168 hours. Recent Labs  Lab 04/12/23 1715  AMMONIA 20   CBC: Recent Labs  Lab 04/12/23 1715 04/13/23 0850  WBC 6.2 8.4  NEUTROABS 2.4  --   HGB 11.9* 11.7*  HCT 34.6* 33.5*  MCV 102.4* 100.9*  PLT 165 184   Cardiac Enzymes: No results for input(s): "CKTOTAL", "CKMB", "CKMBINDEX", "TROPONINI" in the last 168 hours. BNP: Invalid input(s): "POCBNP" CBG: No results for input(s): "GLUCAP" in the last 168 hours. D-Dimer No results for input(s): "DDIMER" in the last 72 hours. Hgb A1c No results for input(s): "HGBA1C" in the last 72 hours. Lipid Profile No results for input(s): "CHOL", "HDL", "LDLCALC", "TRIG", "CHOLHDL", "LDLDIRECT" in the last 72 hours. Thyroid function studies No results for input(s): "TSH", "T4TOTAL", "T3FREE", "THYROIDAB" in the last 72 hours.  Invalid input(s):  "FREET3" Anemia work up No results for input(s): "VITAMINB12", "FOLATE", "FERRITIN", "TIBC", "IRON", "RETICCTPCT" in the last 72 hours. Urinalysis    Component Value Date/Time   COLORURINE YELLOW (A) 04/12/2023 1715   APPEARANCEUR HAZY (A) 04/12/2023 1715   LABSPEC 1.019 04/12/2023 1715   PHURINE 6.0 04/12/2023 1715   GLUCOSEU NEGATIVE 04/12/2023 1715   HGBUR NEGATIVE 04/12/2023 1715   BILIRUBINUR NEGATIVE 04/12/2023 1715   KETONESUR 20 (A) 04/12/2023 1715   PROTEINUR NEGATIVE 04/12/2023 1715   NITRITE NEGATIVE 04/12/2023 1715   LEUKOCYTESUR NEGATIVE 04/12/2023 1715   Sepsis Labs Recent Labs  Lab 04/12/23 1715 04/13/23 0850  WBC 6.2 8.4   Microbiology Recent Results (from the past 240 hour(s))  Blood Culture (routine x 2)     Status: None (Preliminary result)   Collection Time: 04/12/23  5:01 PM   Specimen: BLOOD  Result Value Ref Range Status   Specimen Description BLOOD BLOOD LEFT ARM  Final   Special Requests   Final    BOTTLES DRAWN AEROBIC AND ANAEROBIC Blood Culture adequate volume   Culture   Final    NO GROWTH < 12 HOURS Performed at [facility], [address] [place], [place] [address]    Report Status PENDING  Incomplete  Resp panel by RT-PCR (RSV, Flu A&B, Covid) Anterior Nasal Swab     Status: None   Collection Time: 04/12/23  5:15 PM   Specimen: Anterior Nasal Swab  Result Value Ref Range Status   SARS Coronavirus 2 by RT PCR NEGATIVE NEGATIVE Final    Comment: (NOTE) SARS-CoV-2 target nucleic acids are NOT DETECTED.  The SARS-CoV-2 RNA is generally detectable in upper respiratory specimens during the acute phase of infection. The lowest concentration of SARS-CoV-2 viral copies this assay can detect is 138 copies/mL. A negative result does not preclude SARS-Cov-2 infection and should not be used as the sole basis for treatment or other patient management decisions. A negative result may occur with  improper specimen collection/handling,  submission of specimen other than nasopharyngeal swab, presence of viral mutation(s) within the areas targeted by this assay, and inadequate number of viral copies(<138 copies/mL). A negative result must be combined with clinical observations, patient history, and epidemiological information. The expected result is Negative.  Fact Sheet for Patients:  BloggerCourse.com  Fact Sheet for Healthcare Providers:  SeriousBroker.it  This test is no t yet approved or cleared by the [place] FDA and  has been authorized for detection and/or diagnosis of SARS-CoV-2 by FDA under an Emergency Use Authorization (EUA). This EUA will remain  in effect (meaning this test can be used) for the duration of the COVID-19  declaration under Section 564(b)(1) of the Act, 21 U.S.C.section 360bbb-3(b)(1), unless the authorization is terminated  or revoked sooner.       Influenza A by PCR NEGATIVE NEGATIVE Final   Influenza B by PCR NEGATIVE NEGATIVE Final    Comment: (NOTE) The Xpert Xpress SARS-CoV-2/FLU/RSV plus assay is intended as an aid in the diagnosis of influenza from Nasopharyngeal swab specimens and should not be used as a sole basis for treatment. Nasal washings and aspirates are unacceptable for Xpert Xpress SARS-CoV-2/FLU/RSV testing.  Fact Sheet for Patients: BloggerCourse.com  Fact Sheet for Healthcare Providers: SeriousBroker.it  This test is not yet approved or cleared by the [place] FDA and has been authorized for detection and/or diagnosis of SARS-CoV-2 by FDA under an Emergency Use Authorization (EUA). This EUA will remain in effect (meaning this test can be used) for the duration of the COVID-19 declaration under Section 564(b)(1) of the Act, 21 U.S.C. section 360bbb-3(b)(1), unless the authorization is terminated or revoked.     Resp Syncytial Virus by PCR NEGATIVE  NEGATIVE Final    Comment: (NOTE) Fact Sheet for Patients: BloggerCourse.com  Fact Sheet for Healthcare Providers: SeriousBroker.it  This test is not yet approved or cleared by the [place] FDA and has been authorized for detection and/or diagnosis of SARS-CoV-2 by FDA under an Emergency Use Authorization (EUA). This EUA will remain in effect (meaning this test can be used) for the duration of the COVID-19 declaration under Section 564(b)(1) of the Act, 21 U.S.C. section 360bbb-3(b)(1), unless the authorization is terminated or revoked.  Performed at [facility], [address] [place], [place] [address]   Blood Culture (routine x 2)     Status: None (Preliminary result)   Collection Time: 04/12/23  5:15 PM   Specimen: BLOOD  Result Value Ref Range Status   Specimen Description BLOOD BLOOD LEFT ARM  Final   Special Requests   Final    BOTTLES DRAWN AEROBIC AND ANAEROBIC Blood Culture adequate volume   Culture   Final    NO GROWTH < 12 HOURS Performed at [facility], [address], [place], [place] [address]    Report Status PENDING  Incomplete     Time coordinating discharge: Over 30 minutes  SIGNED:   Charise Killian, MD  [facility] 04/13/2023, 3:39 PM Pager   If 7PM-7AM, please contact night-coverage [email or web address]

## 2023-04-13 NOTE — ED Notes (Signed)
Pt stating that she is wanting to leave without completing care. RN educated pt on importance of staying. Pt stating she wants to leave. IP MD paged regarding situation. Pt stating she will not wait to see MD. Pt requesting RN to take out PIV and deaccess port.

## 2023-04-15 DIAGNOSIS — I890 Lymphedema, not elsewhere classified: Secondary | ICD-10-CM | POA: Diagnosis not present

## 2023-04-15 DIAGNOSIS — R609 Edema, unspecified: Secondary | ICD-10-CM | POA: Diagnosis not present

## 2023-04-17 LAB — CULTURE, BLOOD (ROUTINE X 2)
Culture: NO GROWTH
Culture: NO GROWTH
Special Requests: ADEQUATE
Special Requests: ADEQUATE

## 2023-04-22 DIAGNOSIS — U071 COVID-19: Secondary | ICD-10-CM | POA: Diagnosis not present

## 2024-06-27 NOTE — Progress Notes (Signed)
 HISTORY OF PRESENT ILLNESS: This is a 49 y.o. female who is presenting for post op evaluation for closed displaced fracture of the right tibia. The injury occurred on 06/06/24. The patient underwent evaluation and surgery at [facility] for treatment of the injury. The patient is currently in a fracture boot for immobilization and protection of the injury. The following procedure was performed during today's visit on 06/27/2024.   IMAGES: Pre-Procedure Status: Patients identity was verified using two patient identifiers name and birth date.  A verbal instruction and written order was verified through RN prior to my portion of the patients care beginning.    PROCEDURE: Sutures removal The patient presents for suture removal. The wound is well healed and without signs of infection. The wound was cleaned with chlorhexidine , The 9 +- sutures are removed without difficulty. Steri strips  placed. Wound care and activity instructions were given. Return prn.  Dressing change patients wound cleaned with chlorhexidine  scrub, Vashe, then Hydrafera blue was applied covered with rolled gauze and secured with tape.  Brace fitting. Patients boot was padded with foam and re applied to help prevent the breakdown of skin tissue.  PATIENT INSTRUCTION: Wear brace as directed by your provider, remove only as instructed.   Performed by: OZELL NAEGELI

## 2024-06-27 NOTE — Progress Notes (Signed)
 Review of Systems  Respiratory: Negative.    Cardiovascular: Negative.   Neurological:        Patient reports intermittent  tingling in right toes.
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
## 2024-07-02 ENCOUNTER — Emergency Department

## 2024-07-02 ENCOUNTER — Other Ambulatory Visit: Payer: Self-pay

## 2024-07-02 ENCOUNTER — Emergency Department
Admission: EM | Admit: 2024-07-02 | Discharge: 2024-07-02 | Disposition: A | Discharge status: Other Acute Inpatient Hospital | Attending: Emergency Medicine | Admitting: Emergency Medicine

## 2024-07-02 DIAGNOSIS — M79671 Pain in right foot: Secondary | ICD-10-CM | POA: Diagnosis present

## 2024-07-02 DIAGNOSIS — B372 Candidiasis of skin and nail: Secondary | ICD-10-CM | POA: Diagnosis not present

## 2024-07-02 DIAGNOSIS — L89132 Pressure ulcer of right lower back, stage 2: Secondary | ICD-10-CM | POA: Insufficient documentation

## 2024-07-02 DIAGNOSIS — Z9081 Acquired absence of spleen: Secondary | ICD-10-CM | POA: Diagnosis not present

## 2024-07-02 DIAGNOSIS — G40909 Epilepsy, unspecified, not intractable, without status epilepticus: Secondary | ICD-10-CM | POA: Insufficient documentation

## 2024-07-02 DIAGNOSIS — L89142 Pressure ulcer of left lower back, stage 2: Secondary | ICD-10-CM | POA: Diagnosis not present

## 2024-07-02 DIAGNOSIS — A419 Sepsis, unspecified organism: Secondary | ICD-10-CM | POA: Insufficient documentation

## 2024-07-02 DIAGNOSIS — R4182 Altered mental status, unspecified: Secondary | ICD-10-CM | POA: Insufficient documentation

## 2024-07-02 DIAGNOSIS — L03115 Cellulitis of right lower limb: Secondary | ICD-10-CM | POA: Insufficient documentation

## 2024-07-02 LAB — COMPREHENSIVE METABOLIC PANEL WITH GFR
ALT: 11 U/L (ref 0–44)
AST: 23 U/L (ref 15–41)
Albumin: 3 g/dL — ABNORMAL LOW (ref 3.5–5.0)
Alkaline Phosphatase: 320 U/L — ABNORMAL HIGH (ref 38–126)
Anion gap: 13 (ref 5–15)
BUN: 13 mg/dL (ref 6–20)
CO2: 23 mmol/L (ref 22–32)
Calcium: 8.5 mg/dL — ABNORMAL LOW (ref 8.9–10.3)
Chloride: 102 mmol/L (ref 98–111)
Creatinine, Ser: 0.88 mg/dL (ref 0.44–1.00)
GFR, Estimated: >60 mL/min
Glucose, Bld: 129 mg/dL — ABNORMAL HIGH (ref 70–99)
Potassium: 3.4 mmol/L — ABNORMAL LOW (ref 3.5–5.1)
Sodium: 138 mmol/L (ref 135–145)
Total Bilirubin: 1 mg/dL (ref 0.0–1.2)
Total Protein: 6.6 g/dL (ref 6.5–8.1)

## 2024-07-02 LAB — URINALYSIS, W/ REFLEX TO CULTURE (INFECTION SUSPECTED)
Bilirubin Urine: NEGATIVE
Glucose, UA: NEGATIVE mg/dL
Ketones, ur: 5 mg/dL — AB
Nitrite: NEGATIVE
Protein, ur: 30 mg/dL — AB
RBC / HPF: >50 RBC/hpf (ref 0–5)
Specific Gravity, Urine: 1.026 (ref 1.005–1.030)
WBC, UA: >50 WBC/hpf (ref 0–5)
pH: 6 (ref 5.0–8.0)

## 2024-07-02 LAB — CBC WITH DIFFERENTIAL/PLATELET
Abs Immature Granulocytes: 0.36 K/uL — ABNORMAL HIGH (ref 0.00–0.07)
Basophils Absolute: 0 K/uL (ref 0.0–0.1)
Basophils Relative: 0 %
Eosinophils Absolute: 0 K/uL (ref 0.0–0.5)
Eosinophils Relative: 0 %
HCT: 42.9 % (ref 36.0–46.0)
Hemoglobin: 14.4 g/dL (ref 12.0–15.0)
Immature Granulocytes: 1 %
Lymphocytes Relative: 3 %
Lymphs Abs: 1.1 K/uL (ref 0.7–4.0)
MCH: 29.5 pg (ref 26.0–34.0)
MCHC: 33.6 g/dL (ref 30.0–36.0)
MCV: 87.9 fL (ref 80.0–100.0)
Monocytes Absolute: 1.6 K/uL — ABNORMAL HIGH (ref 0.1–1.0)
Monocytes Relative: 4 %
Neutro Abs: 37.7 K/uL — ABNORMAL HIGH (ref 1.7–7.7)
Neutrophils Relative %: 92 %
Platelets: 92 K/uL — ABNORMAL LOW (ref 150–400)
RBC: 4.88 MIL/uL (ref 3.87–5.11)
RDW: 23.2 % — ABNORMAL HIGH (ref 11.5–15.5)
Smear Review: NORMAL
WBC: 40.7 K/uL — ABNORMAL HIGH (ref 4.0–10.5)
nRBC: 0.2 % (ref 0.0–0.2)

## 2024-07-02 LAB — ETHANOL: Alcohol, Ethyl (B): <15 mg/dL

## 2024-07-02 LAB — LACTIC ACID, PLASMA
Lactic Acid, Venous: 2.6 mmol/L (ref 0.5–1.9)
Lactic Acid, Venous: 1.9 mmol/L (ref 0.5–1.9)

## 2024-07-02 LAB — PROTIME-INR
INR: 0.9 (ref 0.8–1.2)
Prothrombin Time: 13.2 s (ref 11.4–15.2)

## 2024-07-02 LAB — PREGNANCY, URINE: Preg Test, Ur: NEGATIVE

## 2024-07-02 LAB — MAGNESIUM: Magnesium: 1.7 mg/dL (ref 1.7–2.4)

## 2024-07-02 LAB — PHOSPHORUS: Phosphorus: 2.2 mg/dL — ABNORMAL LOW (ref 2.5–4.6)

## 2024-07-02 MED ORDER — METRONIDAZOLE 500 MG/100ML IV SOLN
500.0000 mg | Freq: Once | INTRAVENOUS | Status: AC
  Administered 2024-07-02: 500 mg via INTRAVENOUS
  Filled 2024-07-02: qty 100

## 2024-07-02 MED ORDER — MORPHINE SULFATE (PF) 4 MG/ML IV SOLN
4.0000 mg | Freq: Once | INTRAVENOUS | Status: AC
  Administered 2024-07-02: 4 mg via INTRAVENOUS
  Filled 2024-07-02: qty 1

## 2024-07-02 MED ORDER — ONDANSETRON HCL 4 MG/2ML IJ SOLN
4.0000 mg | Freq: Once | INTRAMUSCULAR | Status: AC
  Administered 2024-07-02: 4 mg via INTRAVENOUS
  Filled 2024-07-02: qty 2

## 2024-07-02 MED ORDER — HYDROMORPHONE HCL 1 MG/ML IJ SOLN
1.0000 mg | Freq: Once | INTRAMUSCULAR | Status: AC
  Administered 2024-07-02: 1 mg via INTRAVENOUS
  Filled 2024-07-02: qty 1

## 2024-07-02 MED ORDER — SODIUM CHLORIDE 0.9 % IV SOLN
2.0000 g | Freq: Once | INTRAVENOUS | Status: AC
  Administered 2024-07-02: 2 g via INTRAVENOUS
  Filled 2024-07-02: qty 12.5

## 2024-07-02 MED ORDER — LACTATED RINGERS IV BOLUS (SEPSIS)
1000.0000 mL | Freq: Once | INTRAVENOUS | Status: AC
  Administered 2024-07-02: 1000 mL via INTRAVENOUS

## 2024-07-02 MED ORDER — VANCOMYCIN HCL IN DEXTROSE 1-5 GM/200ML-% IV SOLN
1000.0000 mg | Freq: Once | INTRAVENOUS | Status: AC
  Administered 2024-07-02: 1000 mg via INTRAVENOUS
  Filled 2024-07-02: qty 200

## 2024-07-02 NOTE — ED Notes (Signed)
 ..  EMTALA: REQUIRED DOCUMENTATION COMPLETED AND REVIEWED BY WRITER PRIOR TO PT TRANSFER MD REASSESSMENT EMTALA RN SECTION TRANSFER E-SIGN VS WITHIN REQUIRED TIME

## 2024-07-02 NOTE — ED Provider Notes (Signed)
 "  [facility] Provider Note    Event Date/Time   First MD Initiated Contact with Patient 07/02/24 1504     (approximate)   History   Chief Complaint: Foot Pain   HPI  Leslie [name] is a 49 y.o. female with a history of anxiety, epilepsy, ITP postsplenectomy who is brought to the ED by EMS due to generalized weakness, incontinence.  Patient complains of generalized abdominal pain, denies chest pain or shortness of breath.  Denies falls or trauma  Patient was recently hospitalized at [facility] after suffering a distal tib-fib fracture, underwent ORIF 06/07/2024.  Has been staying with a friend for the past 2 weeks since then.  He reports patient was initially doing well, able to get up, manage ADLs, and over the last 2 days she has had a precipitous decline.  He has also found multiple empty liquor containers hidden under her close.        Past Medical History:  Diagnosis Date   Anemia    Anxiety    Epilepsy (HCC)    ITP (idiopathic thrombocytopenic purpura)    Obese     Current Outpatient Rx   Order #: [id number] Class: Historical Med   Order #: [id number] Class: Historical Med   Order #: [id number] Class: Normal   Order #: [id number] Class: Historical Med   Order #: [id number] Class: Normal   Order #: [id number] Class: Historical Med   Order #: [id number] Class: Historical Med   Order #: [id number] Class: Historical Med   Order #: [id number] Class: Historical Med   Order #: [id number] Class: Historical Med   Order #: [id number] Class: Historical Med   Order #: [id number] Class: Historical Med   Order #: [id number] Class: Historical Med   Order #: [id number] Class: Historical Med   Order #: [id number] Class: Normal   Order #: [id number] Class: Normal   Order #: [id number] Class: Historical Med   Order #: [id number] Class: Historical Med   Order #: [id number] Class: Historical Med   Order #: [id number] Class: Historical Med   Order #: [id number] Class: Historical Med   Order #:  [id number] Class: Historical Med   Order #: [id number] Class: Historical Med   Order #: [id number] Class: Historical Med   Order #: [id number] Class: Historical Med   Order #: [id number] Class: Historical Med   Order #: [id number] Class: Historical Med   Order #: [id number] Class: Normal   Order #: [id number] Class: Historical Med   Order #: [id number] Class: Normal   Order #: [id number] Class: Historical Med   Order #: [id number] Class: Historical Med    Past Surgical History:  Procedure Laterality Date   ANKLE ARTHROSCOPY     CHOLECYSTECTOMY     GASTRIC BYPASS  2016   KNEE ARTHROSCOPY     OPEN REDUCTION PROXIMAL HUMERUS FRACTURE  Left 3/218   SPLENECTOMY, TOTAL  2006   for ITP   TONSILLECTOMY     TUBAL LIGATION  2009    Physical Exam   Triage Vital Signs: ED Triage Vitals  Encounter Vitals Group     BP 07/02/24 1451 135/66     Girls Systolic BP Percentile --      Girls Diastolic BP Percentile --      Boys Systolic BP Percentile --      Boys Diastolic BP Percentile --      Pulse Rate 07/02/24 1451 (!) 125     Resp 07/02/24 1451 (!) 23     Temp 07/02/24 1451 98 F (36.7 C)     Temp src --      SpO2 07/02/24 1451 100 %  Weight --      Height --      Head Circumference --      Peak Flow --      Pain Score 07/02/24 1458 6     Pain Loc --      Pain Education --      Exclude from Growth Chart --     Most recent vital signs: Vitals:   07/02/24 1451 07/02/24 1614  BP: 135/66   Pulse: (!) 125   Resp: (!) 23   Temp: 98 F (36.7 C)   SpO2: 100% 100%    General: Awake, ill-appearing CV:  Good peripheral perfusion.  Tachycardia heart rate 130 Resp:  Normal effort.  Clear lungs Abd:  No distention.  Soft with generalized tenderness Other:  Dry oral mucosa.  Stage II decubitus ulcer diffusely on the posterior lower back extending down to bilateral mid thighs and perianal and perineal region.  Extensive candidiasis anteriorly in the groin. Right lower leg has swelling and erythema below  the mid shin down to the heel.  Just anterior to medial surgical incision which appears is well-healed, there is a 3 cm open wound with exposed fat layer and some necrotic tissue with inflamed edges.  No purulent drainage.  No crepitus.   ED Results / Procedures / Treatments   Labs (all labs ordered are listed, but only abnormal results are displayed) Labs Reviewed  COMPREHENSIVE METABOLIC PANEL WITH GFR - Abnormal; Notable for the following components:      Result Value   Potassium 3.4 (*)    Glucose, Bld 129 (*)    Calcium 8.5 (*)    Albumin 3.0 (*)    Alkaline Phosphatase 320 (*)    All other components within normal limits  LACTIC ACID, PLASMA - Abnormal; Notable for the following components:   Lactic Acid, Venous 2.6 (*)    All other components within normal limits  CBC WITH DIFFERENTIAL/PLATELET - Abnormal; Notable for the following components:   WBC 40.7 (*)    RDW 23.2 (*)    Platelets 92 (*)    Neutro Abs 37.7 (*)    Monocytes Absolute 1.6 (*)    Abs Immature Granulocytes 0.36 (*)    All other components within normal limits  CULTURE, BLOOD (ROUTINE X 2)  CULTURE, BLOOD (ROUTINE X 2)  C DIFFICILE QUICK SCREEN W PCR REFLEX    LACTIC ACID, PLASMA  URINALYSIS, W/ REFLEX TO CULTURE (INFECTION SUSPECTED)  ETHANOL  MAGNESIUM   PHOSPHORUS  PROTIME-INR  POC URINE PREG, ED     EKG Interpreted by me Sinus tachycardia rate 136.  Normal axis and intervals.  Poor R wave progression.  Normal ST segments and T waves   RADIOLOGY X-ray right tib-fib interpreted by me, post-fixation of proximal and distal tibia and ankle fusion.  There is lucency around a distal tibia transverse screw.  Displaced fracture fragments appear nonacute.  Radiology report reviewed   PROCEDURES:  .Critical Care  Performed by: Viviann Pastor, MD Authorized by: Viviann Pastor, MD   Critical care provider statement:    Critical care time (minutes):  35   Critical care time was exclusive of:   Separately billable procedures and treating other patients   Critical care was necessary to treat or prevent imminent or life-threatening deterioration of the following conditions:  Sepsis, dehydration and metabolic crisis   Critical care was time spent personally by me on the following activities:  Development of treatment plan with patient or surrogate, discussions with  consultants, evaluation of patient's response to treatment, examination of patient, obtaining history from patient or surrogate, ordering and performing treatments and interventions, ordering and review of laboratory studies, ordering and review of radiographic studies, pulse oximetry, re-evaluation of patient's condition and review of old charts   Care discussed with: admitting provider and accepting provider at another facility   Comments:           MEDICATIONS ORDERED IN ED: Medications  metroNIDAZOLE  (FLAGYL ) IVPB 500 mg (500 mg Intravenous New Bag/Given 07/02/24 1806)  vancomycin  (VANCOCIN ) IVPB 1000 mg/200 mL premix (has no administration in time range)  lactated ringers  bolus 1,000 mL (1,000 mLs Intravenous New Bag/Given 07/02/24 1701)    And  lactated ringers  bolus 1,000 mL (1,000 mLs Intravenous New Bag/Given 07/02/24 1808)    And  lactated ringers  bolus 1,000 mL (0 mLs Intravenous Stopped 07/02/24 1731)  ceFEPIme  (MAXIPIME ) 2 g in sodium chloride  0.9 % 100 mL IVPB (2 g Intravenous New Bag/Given 07/02/24 1707)  ondansetron  (ZOFRAN ) injection 4 mg (4 mg Intravenous Given 07/02/24 1703)     IMPRESSION / MDM / ASSESSMENT AND PLAN / ED COURSE  I reviewed the triage vital signs and the nursing notes.  DDx: Cellulitis, osteomyelitis/hardware infection, AKI, electrolyte derangement, C. difficile colitis, pulmonary embolism, UTI, sepsis, alcohol withdrawal, dehydration  Patient's presentation is most consistent with acute presentation with potential threat to life or bodily function.  Patient presents with generalized  weakness, tachycardia tachypnea.  Large posterior decubitus wound, candidiasis of the groin.  Will give fluid bolus and empiric antibiotics for suspected sepsis.  Obtain labs, x-ray CT head.   Clinical Course as of 07/02/24 1846  [place] Jul 02, 2024  1845 D/w [facility] transfer [facility] - pt accepted ED to ED transfer [PS]    Clinical Course User Index [PS] Viviann Pastor, MD     FINAL CLINICAL IMPRESSION(S) / ED DIAGNOSES   Final diagnoses:  Sepsis without acute organ dysfunction, due to unspecified organism [facility])  Cellulitis of right leg     Rx / DC Orders   ED Discharge Orders     None        Note:  This document was prepared using Dragon voice recognition software and may include unintentional dictation errors.   Viviann Pastor, MD 07/02/24 1846  "

## 2024-07-02 NOTE — ED Notes (Signed)
No answer for report at this time.

## 2024-07-02 NOTE — ED Notes (Signed)
 Pt accepted to [facility] Ed to Ed/Accepting Physician Dr Ellouise Danisa/ Report [phone]/Transfer Coordinator is Jacqueline.

## 2024-07-02 NOTE — ED Notes (Signed)
 Called to [facility] for transport to [employer] to Ed.

## 2024-07-02 NOTE — ED Triage Notes (Addendum)
 Patient brought in via [facility]. For foot pain lasting 2 weeks. Patient stated she had surgery on her tibia and went home. Patient states she does not have anyone at home. Very Very strong smell coming from patient. Their is a wound on her right lower leg. Patient also has urine/bowel movement everywhere on sheets from EMS. Patients bottom first layer of skin looks to be gone due to patient possibly sitting in feces for 2-3 weeks. Patient AOX3 at this moment. Multiple RN's in room to assist patient.

## 2024-07-02 NOTE — ED Notes (Signed)
 Pt with 5 open wounds to rt foot ankle, weeping with slough noted. Redness, warmth, swelling, and pain noted.  Wounds cleansed with normal saline.  Nonadherant pads applied and gauze wrap overlay to area.

## 2024-07-02 NOTE — ED Notes (Signed)
 Notified Edp Viviann that pt states morphine  ineffective, requesting additional pain meds.

## 2024-07-03 LAB — BLOOD CULTURE ID PANEL (REFLEXED) - BCID2

## 2024-07-03 NOTE — ED Notes (Addendum)
 Called [facility]--gave blood culture result to patient's RN Beth.   Gram positive Cocci in anaerobic bottle only.

## 2024-07-04 LAB — CULTURE, BLOOD (ROUTINE X 2)

## 2024-07-05 NOTE — Consult Note (Addendum)
 Called [facility] and provided and informed them blood cultures finalized with staph epi. Pt is currently on vancomycin  at [facility] which is appropriate.    Cathaleen Blanch, PharmD, BCPS

## 2024-07-06 NOTE — Progress Notes (Signed)
 Infectious Disease OPAT Treatment Plan Note   Patient is being treated for infection type: skin/soft tissue, bone/joint. Patient Type: general  Infection of Implanted Material: yes, ORIF hardware for management of distal tibia/fibula fracture Pathogens: Streptococcus pyogenes, Escherichia coli   Fax Labs To: [facility], Re: OPAT (fax [phone]) Call For a Change in Clinical Status, Critical/Abnormal Results, or Order Verification: OPAT Pharmacist, ID Clinic (ph [phone] (triage), pager [phone])   OPAT Abx will be monitored by OPAT Monitoring Provider: Selinda Southward, PharmD/Jenna Januszka, PharmD/Tyler Pitcock, PharmD Responsible ID Attending Until ID Follow-up:: Mercer Seip    Flowsheet Row Antimicrobials  Parenteral Antimicrobials   Ceftriaxone  Dose 2 g  Ceftriaxone  Frequency Every 24 hours  Ceftriaxone  End Date 08/16/24  PO Antimicrobials    Requested Labs: Frequency Lab Test Additional Information  Weekly Labs: CBC with diff, CMP Specific Day: Monday (preferred)  Twice Weekly      Other    Doses are current as of 07/06/2024. Please check with pharmacy for changes in dosing prior to discharge.  If patient is discharging home, please use [facility] for antibiotics if possible  If applicable for line care:  1. Do not pull subcutaneous port at end of IV antibiotic therapy  2. PICC (or other line) care per protocol. SASH flushing protocol - 10 mL normal saline flush pre- and post-medication infusion and 5 mL of 100 unit/mL heparin  as final flush Change dressing weekly and as needed. Pharmacy to dispense flush qty w/ refills as needed to maintain IV access as necessary during therapy and until IV access is discontinued.  Dispense IV dressing and supplies for therapy administration.  3. If venipuncture cannot be done and the PICC line needs to be used, then flush the line with 10 mL sterile normal saline, then heparin .   Plan  for Transition to Oral Antibiotics: yes Additional Imaging Required: yes   Additional Imaging Required (Specify): Radiographs of the tibia/fibula    ID follow-up required: ID Follow Up Required: yes  Pending Labs     Order Current Status   Culture, Blood Preliminary result   Culture, Blood Preliminary result   Culture, Fungus Preliminary result   Culture, Fungus Preliminary result   Culture, Fungus Preliminary result   Culture, Fungus Preliminary result   Culture, Fungus Preliminary result   Culture, Fungus Preliminary result   Culture, Mycobacteria, Non-Blood Preliminary result   Culture, Mycobacteria, Non-Blood Preliminary result   Culture, Tissue (Aerobic and Anaerobic) w Gram Stain Preliminary result

## 2024-07-06 NOTE — Progress Notes (Signed)
 " Orthopaedics Progress Note    Leslie [name] is a 49 y.o. female Hospital Day: 4 and 2 Days Post-Op status post Procedure(s): INCISION AND DRAINAGE, COMPLEX, POSTOPERATIVE WOUND INFECTION; ANKLE/FOOT/TOE.   Subjective:  Interval History:  NAEO. Pain well controlled. Denies N/V/F/C. Last BM Date: 07/07/23   Objective:  Temp:  [36.3 C (97.3 F)-36.7 C (98 F)] 36.4 C (97.5 F) Heart Rate:  [91-103] 91 Resp:  [16-18] 16 BP: (113-155)/(55-86) 127/60  Recent Labs  Lab 07/04/24 0725 07/05/24 0402 07/06/24 0413  NA 136   < > 137  K 3.1*   < > 3.8  BUN 7   < > 6*  CREATININE 0.6   < > 0.5  GLUCOSE 106   < > 98  WBC 24.3*   < > 11.4*  HGB 10.2*   < > 9.5*  HCT 30.4*   < > 28.5*  PLT 105*   < > 222  INR 1.0  --   --    < > = values in this interval not displayed.    Intake/Output: In: 960 [P.O.:960] Out: 1820 [Urine:1820]  Urine: 400 mL, Urine Occurrence: 1, Female External Urinary Management System-Output (mL): 820 mL Last BM Date: 07/07/23           Physical Exam: General: alert, cooperative, in NAD   Right lower extremity Dressing: [facility] in place and functioning properly Incision:  dressing left in place Motor: EHL/FHL intact Sensory: S/S/SP/DP/T similar to baseline with some numbness about dorsum of foot Vascular: Cap refill <2 sec   Drain output:  Negative Pressure Wound Therapy Wound Vac Lower;Right;Anterior Leg-Output (mL):  (still at 50 ml mark at this moment) Negative Pressure Wound Therapy NPWT (Incisional) Right Foot-Output (mL): 0 mL Output by Drain (mL) 07/04/24 0701 - 07/04/24 1900 07/04/24 1901 - 07/05/24 0700 07/05/24 0701 - 07/05/24 1900 07/05/24 1901 - 07/06/24 0700 07/06/24 0701 - 07/06/24 0809  Negative Pressure Wound Therapy Wound Vac Lower;Right;Anterior Leg 0      Negative Pressure Wound Therapy NPWT (Incisional) Right Foot    0     Microbiology:  OR cultures growing GPCs  Imaging: none  Physical  therapy:  Plan Treatment/Interventions: Bed mobility;Transfers;Therapeutic exercise;Endurance training;Mobility training PT Frequency: 3x per week Discharge Recommendation ([facility]/DRH): Acute rehab Acute Rehab Justifications: Patient demonstrates need for acute, intensive combined therapies, 3 hrs/day each day.;Physical therapy services are indicated twice a day, each day.;The patient has potential to return to an independent level of function;The patient has potential to return to community ambulation;The patient has family support;The patient has goals for high level balance and functional mobility DME Recommendations: Per accepting facility Plan(Progress Note): Continue current plan of care  Acute Rehab Justifications: Patient demonstrates need for acute, intensive combined therapies, 3 hrs/day each day.;Physical therapy services are indicated twice a day, each day.;The patient has potential to return to an independent level of function;The patient has potential to return to community ambulation;The patient has family support;The patient has goals for high level balance and functional mobility    Assessment & Plan: 2311/2311-01 Leslie [name] is 49 y.o. female who is s/p R ankle superficial I&D on 1/13 doing ok postoperatively.  PLAN:  - Continue to monitor wound vac output, if high output may consider return to OR.  - Pending ID recs. - Continue working with PT. WOUND: wound vac in place DIET: per primary PAIN: per primary DVT PPx: Anticoagulation ok by orthopaedics, regimen per medicine; recommend lovenox  POD0 ABx: broad spectrum pending ID  recs, per primary  PT/OT (WB status): WBAT RLE LABS: WBC downtrending DISPO: Follow up pending clinical progress. Dispo pending OR cultures/antibiotics. PT recommendations if available as follows: Discharge Recommendation ([facility]/DRH): Acute rehab  HAYDEN ALMARIE BASH, MD [facility] Resident  Please do not hesitate to page  the team at [phone]  Future Appointments  Date Time Provider Department Center  07/11/2024  1:30 PM Kallie Kellie Corp, MD [facility]  07/18/2024 11:30 AM Sigurd Vernell Shuck, MD [facility]    "

## 2024-07-07 LAB — CULTURE, BLOOD (ROUTINE X 2): Culture: NO GROWTH
